# Patient Record
Sex: Female | Born: 1975
Health system: Southern US, Community
[De-identification: ages and names within clinical notes are randomized; demographics above are authoritative.]

## PROBLEM LIST (undated history)

## (undated) ENCOUNTER — Inpatient Hospital Stay (HOSPITAL_COMMUNITY): Payer: 59

## (undated) DIAGNOSIS — D259 Leiomyoma of uterus, unspecified: Secondary | ICD-10-CM

## (undated) DIAGNOSIS — J302 Other seasonal allergic rhinitis: Secondary | ICD-10-CM

## (undated) DIAGNOSIS — J45909 Unspecified asthma, uncomplicated: Secondary | ICD-10-CM

## (undated) DIAGNOSIS — N921 Excessive and frequent menstruation with irregular cycle: Secondary | ICD-10-CM

## (undated) DIAGNOSIS — J4520 Mild intermittent asthma, uncomplicated: Secondary | ICD-10-CM

## (undated) DIAGNOSIS — Z862 Personal history of diseases of the blood and blood-forming organs and certain disorders involving the immune mechanism: Secondary | ICD-10-CM

## (undated) DIAGNOSIS — N939 Abnormal uterine and vaginal bleeding, unspecified: Secondary | ICD-10-CM

## (undated) HISTORY — PX: TUBAL LIGATION: SHX77

## (undated) HISTORY — PX: OTHER SURGICAL HISTORY: SHX169

---

## 1997-12-17 ENCOUNTER — Inpatient Hospital Stay (HOSPITAL_COMMUNITY): Admission: AD | Admit: 1997-12-17 | Discharge: 1997-12-17 | Payer: Self-pay | Admitting: Obstetrics

## 1997-12-24 ENCOUNTER — Inpatient Hospital Stay (HOSPITAL_COMMUNITY): Admission: AD | Admit: 1997-12-24 | Discharge: 1997-12-24 | Payer: Self-pay | Admitting: *Deleted

## 1998-01-04 ENCOUNTER — Encounter (HOSPITAL_COMMUNITY): Admission: RE | Admit: 1998-01-04 | Discharge: 1998-01-07 | Payer: Self-pay | Admitting: Obstetrics & Gynecology

## 1998-01-04 ENCOUNTER — Inpatient Hospital Stay (HOSPITAL_COMMUNITY): Admission: AD | Admit: 1998-01-04 | Discharge: 1998-01-07 | Payer: Self-pay | Admitting: Obstetrics & Gynecology

## 1998-08-23 ENCOUNTER — Emergency Department (HOSPITAL_COMMUNITY): Admission: EM | Admit: 1998-08-23 | Discharge: 1998-08-23 | Payer: Self-pay | Admitting: Emergency Medicine

## 1999-11-10 HISTORY — PX: TUBAL LIGATION: SHX77

## 2000-07-16 ENCOUNTER — Encounter: Payer: Self-pay | Admitting: Obstetrics and Gynecology

## 2000-07-16 ENCOUNTER — Ambulatory Visit (HOSPITAL_COMMUNITY): Admission: RE | Admit: 2000-07-16 | Discharge: 2000-07-16 | Payer: Self-pay | Admitting: Obstetrics and Gynecology

## 2000-08-12 ENCOUNTER — Encounter: Payer: Self-pay | Admitting: Obstetrics and Gynecology

## 2000-08-12 ENCOUNTER — Ambulatory Visit (HOSPITAL_COMMUNITY): Admission: RE | Admit: 2000-08-12 | Discharge: 2000-08-12 | Payer: Self-pay | Admitting: Obstetrics and Gynecology

## 2000-08-17 ENCOUNTER — Inpatient Hospital Stay (HOSPITAL_COMMUNITY): Admission: AD | Admit: 2000-08-17 | Discharge: 2000-08-17 | Payer: Self-pay | Admitting: *Deleted

## 2000-08-19 ENCOUNTER — Encounter: Payer: Self-pay | Admitting: Obstetrics and Gynecology

## 2000-08-19 ENCOUNTER — Encounter (HOSPITAL_COMMUNITY): Admission: AD | Admit: 2000-08-19 | Discharge: 2000-09-21 | Payer: Self-pay | Admitting: Obstetrics and Gynecology

## 2000-08-27 ENCOUNTER — Encounter: Payer: Self-pay | Admitting: Obstetrics and Gynecology

## 2000-09-06 ENCOUNTER — Inpatient Hospital Stay (HOSPITAL_COMMUNITY): Admission: AD | Admit: 2000-09-06 | Discharge: 2000-09-06 | Payer: Self-pay | Admitting: Obstetrics and Gynecology

## 2000-09-06 ENCOUNTER — Encounter: Payer: Self-pay | Admitting: Obstetrics and Gynecology

## 2000-09-09 ENCOUNTER — Inpatient Hospital Stay (HOSPITAL_COMMUNITY): Admission: AD | Admit: 2000-09-09 | Discharge: 2000-09-09 | Payer: Self-pay | Admitting: Obstetrics and Gynecology

## 2000-09-10 ENCOUNTER — Encounter: Payer: Self-pay | Admitting: Obstetrics and Gynecology

## 2000-09-13 ENCOUNTER — Encounter: Payer: Self-pay | Admitting: Obstetrics and Gynecology

## 2000-09-20 ENCOUNTER — Encounter (INDEPENDENT_AMBULATORY_CARE_PROVIDER_SITE_OTHER): Payer: Self-pay | Admitting: Specialist

## 2000-09-20 ENCOUNTER — Inpatient Hospital Stay (HOSPITAL_COMMUNITY): Admission: AD | Admit: 2000-09-20 | Discharge: 2000-09-22 | Payer: Self-pay | Admitting: Obstetrics and Gynecology

## 2000-10-25 ENCOUNTER — Emergency Department (HOSPITAL_COMMUNITY): Admission: EM | Admit: 2000-10-25 | Discharge: 2000-10-25 | Payer: Self-pay | Admitting: *Deleted

## 2000-11-15 ENCOUNTER — Other Ambulatory Visit: Admission: RE | Admit: 2000-11-15 | Discharge: 2000-11-15 | Payer: Self-pay | Admitting: Obstetrics and Gynecology

## 2001-01-04 ENCOUNTER — Emergency Department (HOSPITAL_COMMUNITY): Admission: EM | Admit: 2001-01-04 | Discharge: 2001-01-04 | Payer: Self-pay | Admitting: Emergency Medicine

## 2001-08-01 ENCOUNTER — Emergency Department (HOSPITAL_COMMUNITY): Admission: EM | Admit: 2001-08-01 | Discharge: 2001-08-01 | Payer: Self-pay | Admitting: Emergency Medicine

## 2002-08-15 ENCOUNTER — Emergency Department (HOSPITAL_COMMUNITY): Admission: EM | Admit: 2002-08-15 | Discharge: 2002-08-16 | Payer: Self-pay | Admitting: Emergency Medicine

## 2005-09-21 ENCOUNTER — Emergency Department (HOSPITAL_COMMUNITY): Admission: EM | Admit: 2005-09-21 | Discharge: 2005-09-21 | Payer: Self-pay | Admitting: Emergency Medicine

## 2006-04-14 ENCOUNTER — Emergency Department (HOSPITAL_COMMUNITY): Admission: EM | Admit: 2006-04-14 | Discharge: 2006-04-14 | Payer: Self-pay | Admitting: Emergency Medicine

## 2006-12-12 ENCOUNTER — Emergency Department (HOSPITAL_COMMUNITY): Admission: EM | Admit: 2006-12-12 | Discharge: 2006-12-13 | Payer: Self-pay | Admitting: Emergency Medicine

## 2007-01-28 ENCOUNTER — Emergency Department (HOSPITAL_COMMUNITY): Admission: EM | Admit: 2007-01-28 | Discharge: 2007-01-29 | Payer: Self-pay | Admitting: Emergency Medicine

## 2007-07-24 ENCOUNTER — Emergency Department (HOSPITAL_COMMUNITY): Admission: EM | Admit: 2007-07-24 | Discharge: 2007-07-24 | Payer: Self-pay | Admitting: Emergency Medicine

## 2008-04-19 IMAGING — CR DG CHEST 2V
2 series · 2 of 2 positions shown · non-contrast
Comparison: 12/13/2006

CLINICAL DATA: Dyspnea. History of asthma. 
 CHEST - 2 VIEW:

[w chest pa]
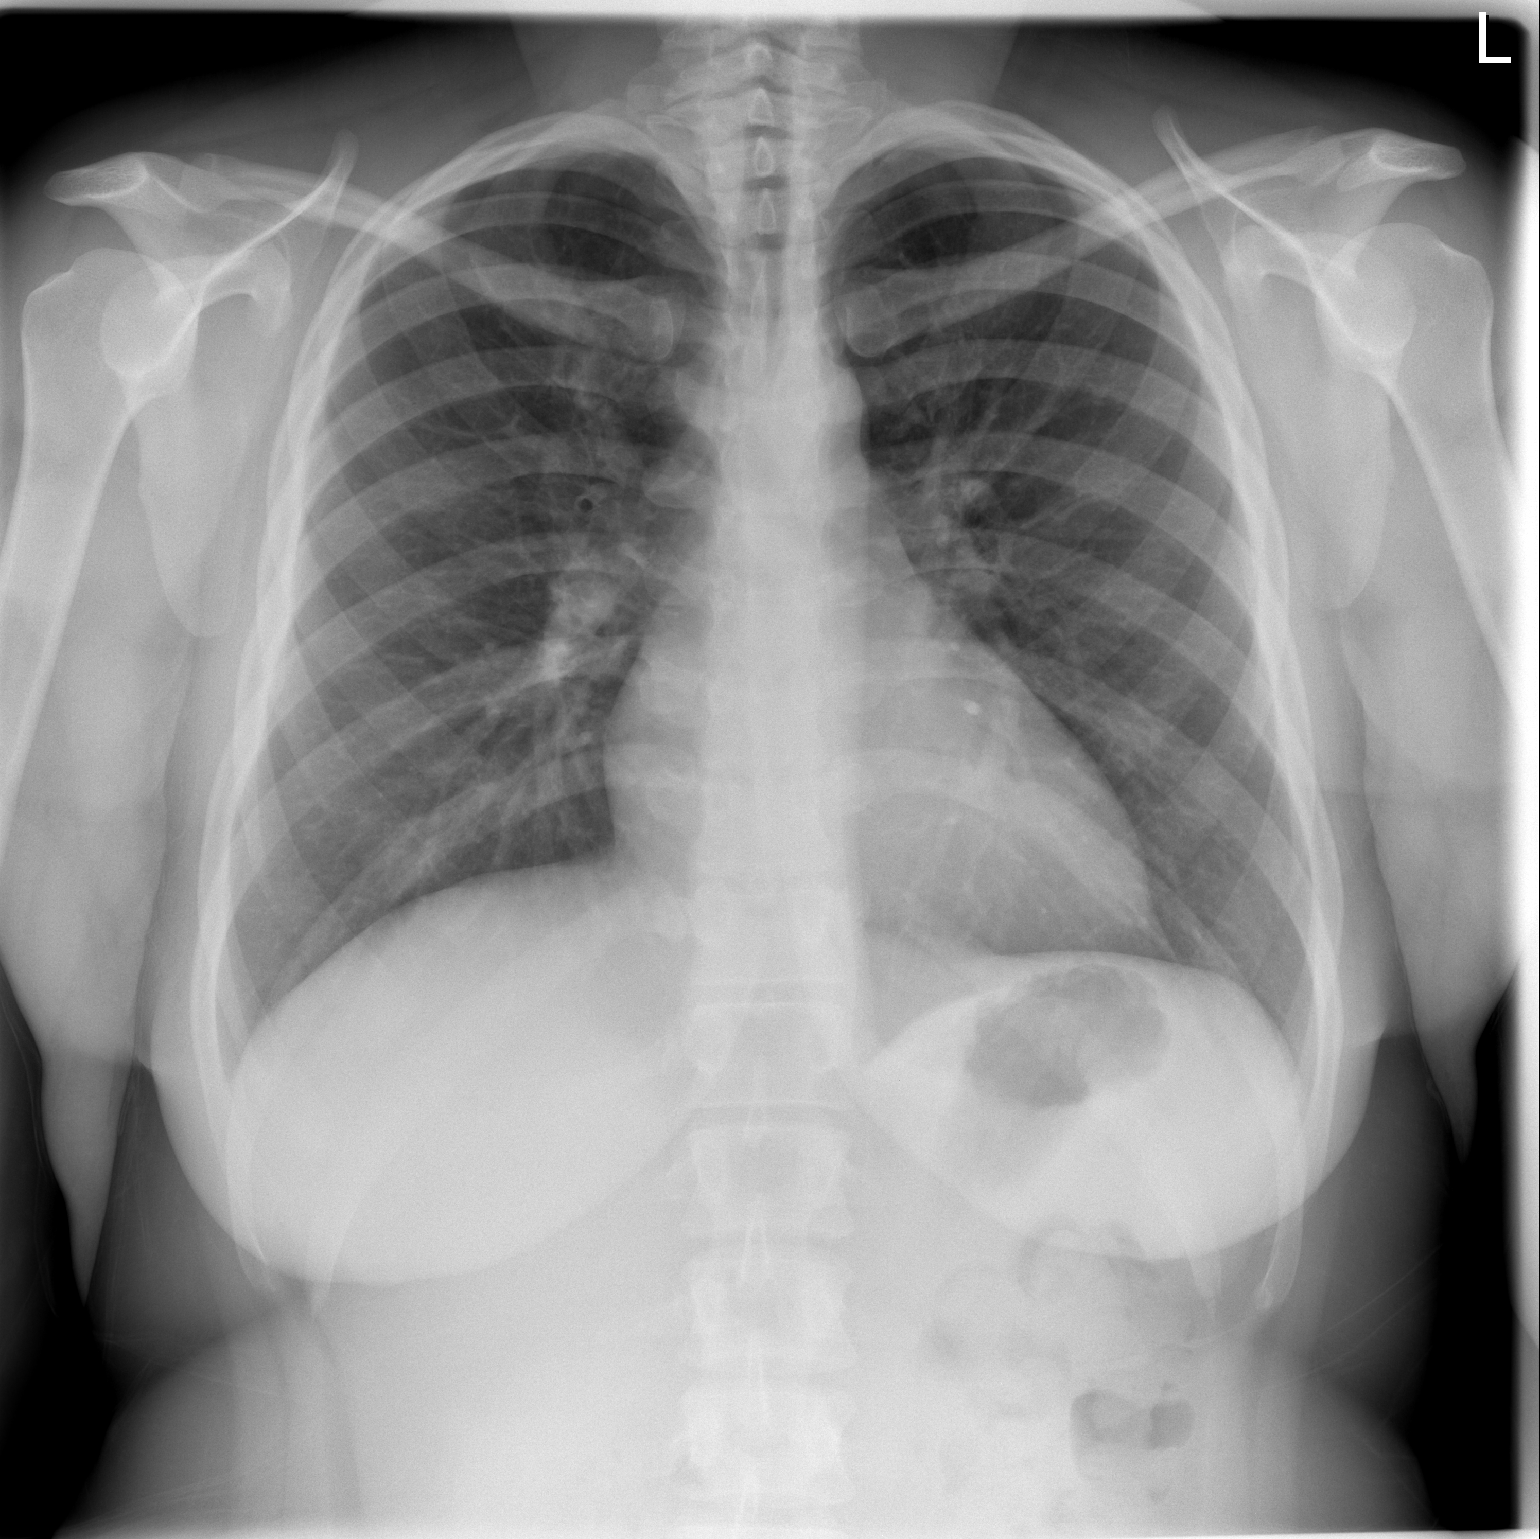

[w chest lat]
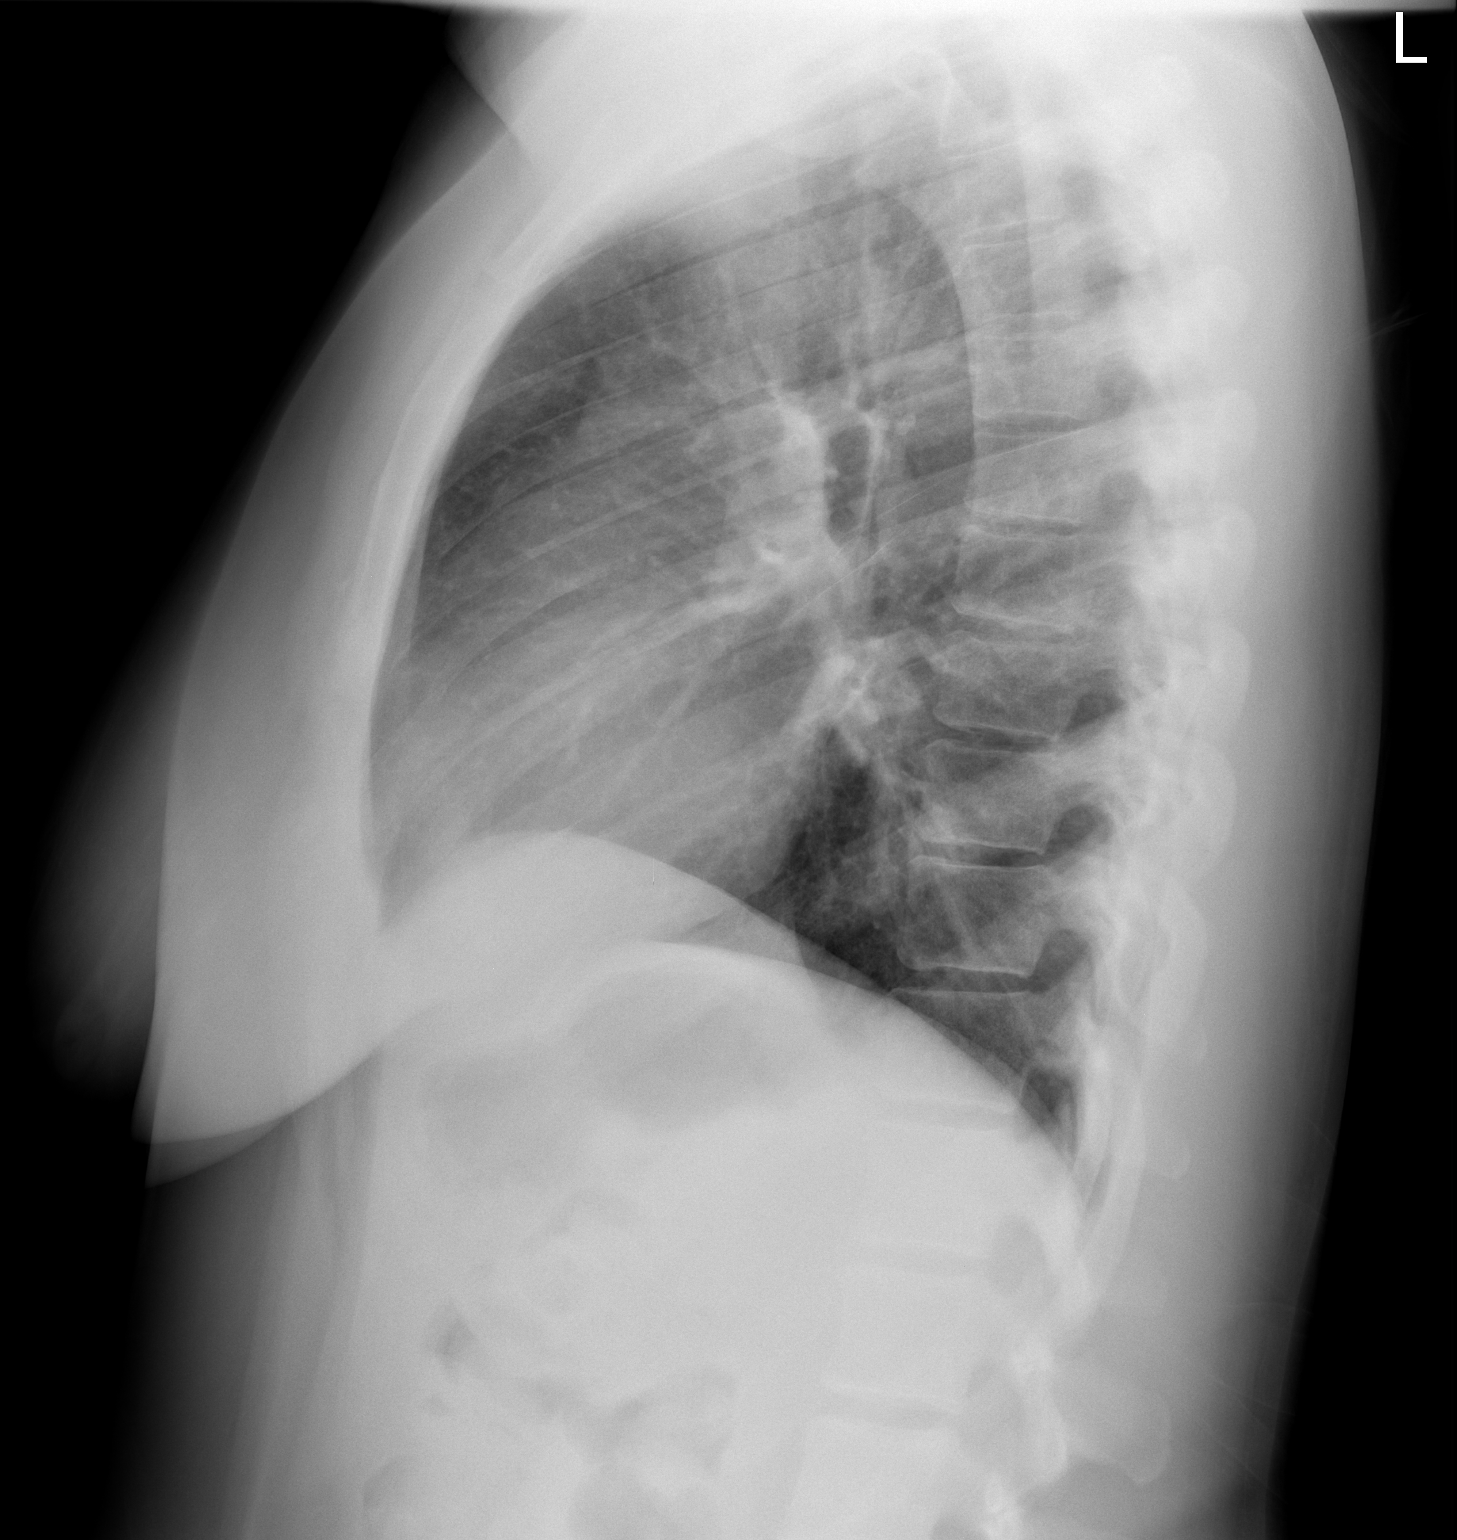

[2 of 2 positions shown; findings below may reference images not displayed]

FINDINGS: Mild peribronchial thickening is noted without focal airspace disease or areas of atelectasis. The lungs are otherwise clear. No pleural effusions or pneumothorax are identified. Cardiomediastinal silhouette is within normal limits as is the bony thorax and upper abdomen.
IMPRESSION: Mild peribronchial thickening without focal airspace disease.

## 2009-06-20 ENCOUNTER — Emergency Department (HOSPITAL_COMMUNITY): Admission: EM | Admit: 2009-06-20 | Discharge: 2009-06-20 | Payer: Self-pay | Admitting: Family Medicine

## 2011-02-14 LAB — POCT I-STAT, CHEM 8
BUN: 10 mg/dL (ref 6–23)
Hemoglobin: 14.3 g/dL (ref 12.0–15.0)
Potassium: 4.1 mEq/L (ref 3.5–5.1)
Sodium: 139 mEq/L (ref 135–145)
TCO2: 20 mmol/L (ref 0–100)

## 2011-03-27 NOTE — Op Note (Signed)
[facility]  Patient:    Brooke [name], Brooke [name]                      MRN: [id number] Proc. Date: 09/21/00 Adm. Date:  [id number] Disc. Date: [id number] Attending:  Oliver Pila                           Operative Report  PREOPERATIVE DIAGNOSES:       Postpartum, desires surgical sterility.  POSTOPERATIVE DIAGNOSES:      Postpartum, desires surgical sterility.  PROCEDURE:                    Bilateral partial salpingectomy.  SURGEON:                      Zenaida Niece, M.D.  ANESTHESIA:                   Epidural.  ESTIMATED BLOOD LOSS:         Less than 50 cc.  FINDINGS:                     Normal anatomy.  COUNTS:                       Correct.  CONDITION:                    Stable.  PROCEDURE:                    After appropriate informed consent was obtained the patient was taken to the operating room and placed in the dorsal supine position.  Her previously placed epidural was dosed appropriately.  Her abdomen was prepped and draped in the usual sterile fashion.  The level for anesthesia was found to be adequate.  Her infraumbilical skin was infiltrated with 1/4% Marcaine and a 3 cm horizontal incision was made.  The fascia was grasped with Kelly clamps and entered sharply and this also entered the peritoneal cavity.  Both fallopian tubes were then identified and traced to their fimbriated ends.  A knuckle of tube on each side was grasped with a Babcock clamp, ligated with 0 plain gut suture, and the knuckle of tube removed sharply.  Both sides of the stumps were hemostatic and both ostia were identified.  The fascia was closed in a running fashion with 0 Vicryl and the skin closed with a running subcuticular suture of 3-0 Vicryl followed by a Band-aid.  Patient tolerated the procedure well and was taken to the recovery room in stable condition. DD:  09/21/00 TD:  09/21/00 Job: [id number]

## 2011-03-27 NOTE — Discharge Summary (Signed)
[facility]  Patient:    Brooke [name], Brooke [name]                      MRN: [id number] Adm. Date:  [id number] Disc. Date: [id number] Attending:  Michaele Offer                           Discharge Summary  ADMISSION DIAGNOSES:          1. Twin pregnancy at 38 weeks.                               2. Pregnancy-induced hypertension.                               3. Thrombocytopenia.                               4. Desired surgical sterility.  DISCHARGE DIAGNOSES:          1. Twin pregnancy at 38 weeks.                               2. Pregnancy-induced hypertension.                               3. Thrombocytopenia.                               4. Desired surgical sterility.  PROCEDURES:                   1. Spontaneous vaginal delivery of viable twins.                                  Twin A vertex, vertex B was a breech                                  extraction.                               2. Postpartum tubal ligation.  COMPLICATIONS:                None.  CONSULTATIONS:                None.  HISTORY OF PRESENT ILLNESS:   This is a 35 year old black female, gravida 4, para 2-0-1-2, with an EGA of 38+ weeks by an 18-week ultrasound, with an EDC of October 01, 2000, with twins.  She was seen in the office today for her scheduled visit by Dr. Senaida Ores.  Today, meaning the day of admission, blood pressure was 140/90.  She had no proteinuria.  No significant symptoms. Vaginal exam was 4, 50, and -3, with a vertex presentation.  She was sent to maternity admissions.  Her blood pressures were stable, but her platelets were only 95,000, so she was admitted for induction.  Prenatal care complicated only by twins.  Last ultrasound for an  estimated fetal weight on August 19, 2000, revealed estimated fetal weights of 2820 and 3100 g, and she has been followed with non-stress test for prior discordant growth.  PRENATAL LABORATORY DATA:     Blood type  A negative, with a negative antibody screen.  Sickle screen is negative.  RPR nonreactive.  Rubella immune. Hepatitis B surface antigen negative.  HIV negative.  Gonorrhea and chlamydia negative.  Triple screen normal.  One-hour Glucola 81.  Group B strep negative.  OBSTETRICAL HISTORY:          In 1997, vaginal delivery at 40 weeks, 7 pounds 10 ounces, no complications.  In 1999, vaginal delivery at 40 weeks, 8 pounds 8 ounces, no complications.  In 2000, she had a spontaneous abortion.  GYNECOLOGICAL HISTORY:        Chlamydia and gonorrhea in 1998.  PAST MEDICAL HISTORY:         Mild asthma.  ALLERGIES:                    PENICILLIN.  PHYSICAL EXAMINATION:         Blood pressures 120s-140s/60s-90s, and she was afebrile.  Fetal heart tracing was reassuring x 2.  On the monitor she had initially irregular contractions.  Abdomen was gravid, nontender, with a fundal height of 51 cm.  Extremities had 1+ edema, nontender.  DTRs were 2/4 and symmetric.  On my first exam on labor and delivery she was 5, 30, -1, with vertex presentation.  HOSPITAL COURSE:              The patient was admitted due to her low platelets and slightly elevated blood pressures with otherwise normal [facility] laboratories and started on Pitocin induction.  She progressed to 5 cm and, at that time, I was able to perform artificial rupture of membranes which revealed clear amniotic fluid.  Her blood pressures remained stable, and she did not require magnesium sulfate.  She continued to progress in active labor and received an epidural.  On the evening of September 20, 2000, she progressed to complete and was taken to the OR.  Baby A delivered vaginally, vertex. Viable female, Apgars of 8 and 9 and weight of 6 pounds 11 ounces.  Baby B was delivered vaginally, a breech extraction.  Viable female, Apgars of 2 and 9, and weight of 7 pounds 1 ounce.  Placenta delivered spontaneously and was intact. Estimated blood loss was 800  cc, and she had no lacerations.  Postpartum she did have some intermittent uterine atony which was treated with one shot of Hemabate which achieved uterine tone and no significant bleeding.  Predelivery hemoglobin was 11.7, postdelivery was 11.8.  On the afternoon of postpartum day #1, she desired surgical sterility and had a bilateral partial salpingectomy performed under her epigastric anesthesia.  This was done without complications, and she had normal anatomy.  She recovered well from this and, on the morning of postpartum day #2, was feeding her babies well and was stable enough for discharge.  Blood pressures were 120s-130s/80s-90s.  Her dressing over her tubal incision was dry.  CONDITION ON DISCHARGE:       Stable.  DISPOSITION:                  Discharged to home.  DIET:                         Regular.  ACTIVITY:  Pelvic rest.  FOLLOW-UP:                    In four to six weeks.  MEDICATIONS:                  Tylenol No. 3 p.r.n. pain.  DISCHARGE INSTRUCTIONS:       She was given our discharge summary pamphlet. DD:  09/22/00 TD:  09/22/00 Job: [id number]

## 2011-08-21 LAB — POCT RAPID STREP A: Streptococcus, Group A Screen (Direct): NEGATIVE

## 2012-04-26 ENCOUNTER — Encounter (HOSPITAL_COMMUNITY): Payer: Self-pay

## 2012-04-26 ENCOUNTER — Emergency Department (HOSPITAL_COMMUNITY)
Admission: EM | Admit: 2012-04-26 | Discharge: 2012-04-26 | Disposition: A | Discharge status: Home / Self Care | Payer: 59 | Source: Home / Self Care | Attending: Emergency Medicine | Admitting: Emergency Medicine

## 2012-04-26 DIAGNOSIS — N390 Urinary tract infection, site not specified: Secondary | ICD-10-CM

## 2012-04-26 LAB — POCT URINALYSIS DIP (DEVICE)
Bilirubin Urine: NEGATIVE
Ketones, ur: NEGATIVE mg/dL
Nitrite: POSITIVE — AB
Protein, ur: NEGATIVE mg/dL

## 2012-04-26 LAB — POCT PREGNANCY, URINE: Preg Test, Ur: NEGATIVE

## 2012-04-26 MED ORDER — NITROFURANTOIN MONOHYD MACRO 100 MG PO CAPS: 100.0000 mg | ORAL_CAPSULE | Freq: Two times a day (BID) | ORAL | Status: AC

## 2012-04-26 NOTE — Discharge Instructions (Signed)
Drink LOTS of liquids. Finish all of the antibiotics, even if you are feeling better.   Urinary Tract Infection Infections of the urinary tract can start in several places. A bladder infection (cystitis), a kidney infection (pyelonephritis), and a prostate infection (prostatitis) are different types of urinary tract infections (UTIs). They usually get better if treated with medicines (antibiotics) that kill germs. Take all the medicine until it is gone. You or your child may feel better in a few days, but TAKE ALL MEDICINE or the infection may not respond and may become more difficult to treat. HOME CARE INSTRUCTIONS   Drink enough water and fluids to keep the urine clear or pale yellow. Cranberry juice is especially recommended, in addition to large amounts of water.   Avoid caffeine, tea, and carbonated beverages. They tend to irritate the bladder.   Alcohol may irritate the prostate.   Only take over-the-counter or prescription medicines for pain, discomfort, or fever as directed by your caregiver.  To prevent further infections:  Empty the bladder often. Avoid holding urine for long periods of time.   After a bowel movement, women should cleanse from front to back. Use each tissue only once.   Empty the bladder before and after sexual intercourse.  FINDING OUT THE RESULTS OF YOUR TEST Not all test results are available during your visit. If your or your child's test results are not back during the visit, make an appointment with your caregiver to find out the results. Do not assume everything is normal if you have not heard from your caregiver or the medical facility. It is important for you to follow up on all test results. SEEK MEDICAL CARE IF:   There is back pain.   Your baby is older than 3 months with a rectal temperature of 100.5 F (38.1 C) or higher for more than 1 day.   Your or your child's problems (symptoms) are no better in 3 days. Return sooner if you or your child is  getting worse.  SEEK IMMEDIATE MEDICAL CARE IF:   There is severe back pain or lower abdominal pain.   You or your child develops chills.   You have a fever.   Your baby is older than 3 months with a rectal temperature of 102 F (38.9 C) or higher.   Your baby is 8 months old or younger with a rectal temperature of 100.4 F (38 C) or higher.   There is nausea or vomiting.   There is continued burning or discomfort with urination.  MAKE SURE YOU:   Understand these instructions.   Will watch your condition.   Will get help right away if you are not doing well or get worse.  Document Released: 08/05/2005 Document Revised: 10/15/2011 Document Reviewed: 03/10/2007 [facility] Patient Information 2012 [place], [place].

## 2012-04-26 NOTE — ED Provider Notes (Signed)
History     CSN: [id number]  Arrival date & time 04/26/12  1102   None     Chief Complaint  Patient presents with  . Urinary Tract Infection    (Consider location/radiation/quality/duration/timing/severity/associated sxs/prior treatment) Patient is a 36 y.o. female presenting with dysuria. The history is provided by the patient.  Dysuria  This is a new problem. Episode onset: 5 days ago. The problem occurs every urination. The problem has been gradually worsening. The quality of the pain is described as burning. The pain is at a severity of 5/10. The pain is moderate. There has been no fever. Associated symptoms include frequency and urgency. Pertinent negatives include no chills, no nausea, no vomiting and no flank pain. Treatments tried: OTC azo for pain. Her past medical history does not include recurrent UTIs.    History reviewed. No pertinent past medical history.  History reviewed. No pertinent past surgical history.  History reviewed. No pertinent family history.  History  Substance Use Topics  . Smoking status: Not on file  . Smokeless tobacco: Not on file  . Alcohol Use: Not on file    OB History    Grav Para Term Preterm Abortions TAB SAB Ect Mult Living                  Review of Systems  Constitutional: Negative for fever and chills.  Gastrointestinal: Positive for abdominal pain. Negative for nausea and vomiting.  Genitourinary: Positive for dysuria, urgency and frequency. Negative for flank pain.    Allergies  Penicillins  Home Medications   Current Outpatient Rx  Name Route Sig Dispense Refill  . NITROFURANTOIN MONOHYD MACRO 100 MG PO CAPS Oral Take 1 capsule (100 mg total) by mouth 2 (two) times daily. 14 capsule 0    BP 137/90  Pulse 90  Temp 98.7 F (37.1 C) (Oral)  Resp 16  SpO2 100%  LMP 04/03/2012  Physical Exam  Constitutional: She appears well-developed and well-nourished. No distress.  Cardiovascular: Normal rate and normal  heart sounds.   Pulmonary/Chest: Effort normal and breath sounds normal.  Abdominal: Normal appearance and bowel sounds are normal. There is tenderness in the suprapubic area. There is no rigidity, no rebound, no guarding and no CVA tenderness.    ED Course  Procedures (including critical care time)  Labs Reviewed  POCT URINALYSIS DIP (DEVICE) - Abnormal; Notable for the following:    Glucose, UA 100 (*)     Nitrite POSITIVE (*)     Leukocytes, UA SMALL (*)  Biochemical Testing Only. Please order routine urinalysis from main lab if confirmatory testing is needed.   All other components within normal limits  POCT PREGNANCY, URINE   No results found.   1. UTI (lower urinary tract infection)       MDM          Cathlyn Parsons, NP 04/26/12 1134

## 2012-04-26 NOTE — ED Notes (Addendum)
C/o pain , urgency of urination since last week; used home test for uti that was positive; denies any STD concerns

## 2012-04-26 NOTE — ED Provider Notes (Signed)
Medical screening examination/treatment/procedure(s) were performed by non-physician practitioner and as supervising physician I was immediately available for consultation/collaboration.  Raynald Blend, MD 04/26/12 1413

## 2012-06-03 ENCOUNTER — Encounter (HOSPITAL_COMMUNITY): Payer: Self-pay | Admitting: *Deleted

## 2012-06-03 ENCOUNTER — Emergency Department (HOSPITAL_COMMUNITY)
Admission: EM | Admit: 2012-06-03 | Discharge: 2012-06-03 | Disposition: A | Discharge status: Home / Self Care | Payer: 59 | Source: Home / Self Care

## 2012-06-03 DIAGNOSIS — R350 Frequency of micturition: Secondary | ICD-10-CM

## 2012-06-03 DIAGNOSIS — R109 Unspecified abdominal pain: Secondary | ICD-10-CM

## 2012-06-03 DIAGNOSIS — R102 Pelvic and perineal pain: Secondary | ICD-10-CM

## 2012-06-03 DIAGNOSIS — N390 Urinary tract infection, site not specified: Secondary | ICD-10-CM

## 2012-06-03 HISTORY — DX: Unspecified asthma, uncomplicated: J45.909

## 2012-06-03 LAB — POCT URINALYSIS DIP (DEVICE)
Glucose, UA: NEGATIVE mg/dL
Ketones, ur: NEGATIVE mg/dL
Protein, ur: 30 mg/dL — AB
Specific Gravity, Urine: 1.025 (ref 1.005–1.030)
pH: 7 (ref 5.0–8.0)

## 2012-06-03 LAB — POCT PREGNANCY, URINE: Preg Test, Ur: NEGATIVE

## 2012-06-03 MED ORDER — CIPROFLOXACIN HCL 500 MG PO TABS: 500.0000 mg | ORAL_TABLET | Freq: Two times a day (BID) | ORAL | Status: AC

## 2012-06-03 MED ORDER — PHENAZOPYRIDINE HCL 200 MG PO TABS: 200.0000 mg | ORAL_TABLET | Freq: Three times a day (TID) | ORAL | Status: AC

## 2012-06-03 NOTE — ED Notes (Signed)
pT  HAS  SYMPTOMS  OF  URINARY  FREQUENCY   WITH  LOW  ABD  PAIN       AND  DISCOMFORT -  SHE  REPORTS  ABOUT  1  MONTH   AGO  SHE  HAD  A  UTI  AND  WAS  TREATED  WITH  ANTI  BIOTICS        SHE  DENYS  ANY  VAGINAL BLEEDING OR  DISCHARGE

## 2012-06-03 NOTE — ED Provider Notes (Signed)
History     CSN: [id number]  Arrival date & time 06/03/12  1436   None     Chief Complaint  Patient presents with  . Urinary Frequency    (Consider location/radiation/quality/duration/timing/severity/associated sxs/prior treatment) Patient is a 36 y.o. female presenting with frequency. The history is provided by the patient.  Urinary Frequency This is a new problem. The current episode started yesterday. The problem occurs constantly. The problem has been gradually worsening. Associated symptoms include abdominal pain. Nothing aggravates the symptoms. Nothing relieves the symptoms. She has tried nothing for the symptoms.  Previously treated for same one month ago with Nitrofurantoin.  Reports relief of symptoms.  Past Medical History  Diagnosis Date  . Asthma     Past Surgical History  Procedure Date  . Btl     History reviewed. No pertinent family history.  History  Substance Use Topics  . Smoking status: Not on file  . Smokeless tobacco: Not on file  . Alcohol Use:     OB History    Grav Para Term Preterm Abortions TAB SAB Ect Mult Living                  Review of Systems  Constitutional: Negative for fever and chills.  Gastrointestinal: Positive for abdominal pain.  Genitourinary: Positive for urgency, frequency, difficulty urinating and pelvic pain. Negative for dysuria, hematuria, flank pain, decreased urine volume, vaginal bleeding, vaginal discharge, menstrual problem and dyspareunia.  Hematological: Negative for adenopathy.    Allergies  Penicillins  Home Medications   Current Outpatient Rx  Name Route Sig Dispense Refill  . ALBUTEROL SULFATE HFA 108 (90 BASE) MCG/ACT IN AERS Inhalation Inhale 2 puffs into the lungs every 6 (six) hours as needed.    Marland Kitchen CIPROFLOXACIN HCL 500 MG PO TABS Oral Take 1 tablet (500 mg total) by mouth every 12 (twelve) hours. 10 tablet 0    BP 140/75  Pulse 78  Temp 98.2 F (36.8 C) (Oral)  Resp 16  SpO2 98%  LMP  04/21/2012  Physical Exam  Nursing note and vitals reviewed. Constitutional: She is oriented to person, place, and time. Vital signs are normal. She appears well-developed and well-nourished. She is active and cooperative.  HENT:  Head: Normocephalic.  Eyes: Conjunctivae are normal. Pupils are equal, round, and reactive to light. No scleral icterus.  Neck: Trachea normal. Neck supple.  Cardiovascular: Normal rate and regular rhythm.   Pulmonary/Chest: Effort normal and breath sounds normal.  Abdominal: Soft. Normal appearance and bowel sounds are normal. She exhibits no distension and no mass. There is no hepatosplenomegaly. There is tenderness in the suprapubic area. There is no rebound and no CVA tenderness. Hernia confirmed negative in the right inguinal area and confirmed negative in the left inguinal area.  Neurological: She is alert and oriented to person, place, and time. No cranial nerve deficit or sensory deficit.  Skin: Skin is warm and dry.  Psychiatric: She has a normal mood and affect. Her speech is normal and behavior is normal. Judgment and thought content normal. Cognition and memory are normal.    ED Course  Procedures (including critical care time)  Labs Reviewed  POCT URINALYSIS DIP (DEVICE) - Abnormal; Notable for the following:    Hgb urine dipstick MODERATE (*)     Protein, ur 30 (*)     Leukocytes, UA LARGE (*)  Biochemical Testing Only. Please order routine urinalysis from main lab if confirmatory testing is needed.   All other components  within normal limits  POCT PREGNANCY, URINE  URINE CULTURE   No results found.   1. Urinary frequency   2. Suprapubic abdominal pain   3. Urinary tract infection       MDM  Await culture, take antibiotics as prescribed, increase fluid intake.        Johnsie Kindred, NP 06/03/12 1641

## 2012-06-03 NOTE — ED Provider Notes (Signed)
Medical screening examination/treatment/procedure(s) were performed by non-physician practitioner and as supervising physician I was immediately available for consultation/collaboration.  Leslee Home, M.D.   Reuben Likes, MD 06/03/12 2204

## 2012-06-06 LAB — URINE CULTURE: Special Requests: NORMAL

## 2012-06-06 NOTE — ED Notes (Signed)
Urine culture: >100,000 colonies E.Coli.  Pt. adequately treated with Cipro. [name] 06/06/2012

## 2012-09-23 ENCOUNTER — Ambulatory Visit (INDEPENDENT_AMBULATORY_CARE_PROVIDER_SITE_OTHER): Payer: 59 | Admitting: Family Medicine

## 2012-09-23 ENCOUNTER — Encounter: Payer: Self-pay | Admitting: Family Medicine

## 2012-09-23 VITALS — BP 134/69 | HR 86 | Temp 98.6°F | Ht 65.0 in | Wt 201.1 lb

## 2012-09-23 DIAGNOSIS — J069 Acute upper respiratory infection, unspecified: Secondary | ICD-10-CM

## 2012-09-23 MED ORDER — ACETAMINOPHEN-CODEINE #3 300-30 MG PO TABS: 1.0000 | ORAL_TABLET | Freq: Three times a day (TID) | ORAL | Status: DC | PRN

## 2012-09-23 MED ORDER — BENZONATATE 100 MG PO CAPS
100.0000 mg | ORAL_CAPSULE | Freq: Three times a day (TID) | ORAL | Status: DC | PRN
Start: 2012-09-23 — End: 2015-08-08

## 2012-09-23 NOTE — Progress Notes (Signed)
  Subjective:     Brooke [name] is a 36 y.o. female who presents for evaluation of symptoms of a URI. Symptoms include congestion, cough described as nonproductive, no  fever and sore throat. Onset of symptoms was 1 week ago, and has been unchanged since that time.  Symptoms are worst at night.  She has had trouble sleeping due to cough. Treatment to date: Albuterol, Mucinex, and other OTC remedies with little relief.  Denies any fevers at home, chills, CP, or SOB.  She did have one episode of post-tussive emesis last Saturday only.  Review of Systems Pertinent items are noted in HPI.   Objective:    BP 134/69  Pulse 86  Temp 98.6 F (37 C) (Oral)  Ht 5\' 5"  (1.651 m)  Wt 201 lb 2 oz (91.23 kg)  BMI 33.47 kg/m2  LMP 09/11/2012 General appearance: alert, cooperative and no distress Head: Normocephalic, without obvious abnormality, atraumatic Nose: clear discharge, mild congestion Throat: lips, mucosa, and tongue normal; teeth and gums normal Neck: mild anterior cervical adenopathy Lungs: clear to auscultation bilaterally Heart: regular rate and rhythm, S1, S2 normal, no murmur, click, rub or gallop Skin: Skin color, texture, turgor normal. No rashes or lesions   Assessment:    viral upper respiratory illness   Plan:    See Problem List

## 2012-09-23 NOTE — Assessment & Plan Note (Signed)
Likely viral URI with post-viral cough.  Afebrile and no wheezes on exam. - Will treat with Tessalon perles PRN during daytime - Tylenol #3 PRN at bedtime - Encouraged plenty of rest and fluids - Red flags reviewed with patient and per AVS

## 2012-09-23 NOTE — Patient Instructions (Addendum)
If you develop fever (temp 101 or higher), worsening cough, decreased appetite, or nausea/vomiting, please call your doctor. Drink plenty of fluids and get plenty of rest this weekend. Hope you feel better soon!  Upper Respiratory Infection, Adult An upper respiratory infection (URI) is also known as the common cold. It is often caused by a type of germ (virus). Colds are easily spread (contagious). You can pass it to others by kissing, coughing, sneezing, or drinking out of the same glass. Usually, you get better in 1 or 2 weeks.  HOME CARE   Only take medicine as told by your doctor.  Use a warm mist humidifier or breathe in steam from a hot shower.  Drink enough water and fluids to keep your pee (urine) clear or pale yellow.  Get plenty of rest.  Return to work when your temperature is back to normal or as told by your doctor. You may use a face mask and wash your hands to stop your cold from spreading. GET HELP RIGHT AWAY IF:   After the first few days, you feel you are getting worse.  You have questions about your medicine.  You have chills, shortness of breath, or brown or red spit (mucus).  You have yellow or brown snot (nasal discharge) or pain in the face, especially when you bend forward.  You have a fever, puffy (swollen) neck, pain when you swallow, or white spots in the back of your throat.  You have a bad headache, ear pain, sinus pain, or chest pain.  You have a high-pitched whistling sound when you breathe in and out (wheezing).  You have a lasting cough or cough up blood.  You have sore muscles or a stiff neck. MAKE SURE YOU:   Understand these instructions.  Will watch your condition.  Will get help right away if you are not doing well or get worse. Document Released: 04/13/2008 Document Revised: 01/18/2012 Document Reviewed: 03/02/2011 [facility] Patient Information 2013 [place], [place].

## 2013-11-20 ENCOUNTER — Encounter: Payer: Self-pay | Admitting: Family Medicine

## 2013-11-20 ENCOUNTER — Ambulatory Visit (INDEPENDENT_AMBULATORY_CARE_PROVIDER_SITE_OTHER): Payer: 59 | Admitting: Family Medicine

## 2013-11-20 VITALS — BP 120/83 | HR 84 | Temp 98.6°F | Resp 14 | Ht 65.0 in | Wt 197.8 lb

## 2013-11-20 DIAGNOSIS — J069 Acute upper respiratory infection, unspecified: Secondary | ICD-10-CM

## 2013-11-20 NOTE — Progress Notes (Signed)
Pt is here to establish care. Complains of being sick recently, but has improved. Had a sore throat, body ache, cough with clear mucus, hot and cold chills, off and on headaches, hoarseness of voice x1 week.

## 2013-11-20 NOTE — Progress Notes (Signed)
   Subjective:    Patient ID: Brooke [name], female    DOB: [birth year], 38 y.o.   MRN: [id number]  HPI Pt presents for sinus congestion since last Wednesday. She reports cough, sinus pressure/pain, chills. Denies fever. Endorses headache. She has taken mucinex which helps. She reports it was worst on Friday and has improved significantly since then. 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   Review of Systems  Constitutional: Positive for chills. Negative for fever.  HENT: Positive for congestion, postnasal drip, rhinorrhea, sinus pressure and voice change.   Respiratory: Positive for cough and shortness of breath.   All other systems reviewed and are negative.       Objective:   Physical Exam  Nursing note and vitals reviewed. Constitutional: She is oriented to person, place, and time. She appears well-developed and well-nourished. No distress.  HENT:  Head: Normocephalic and atraumatic.  Right Ear: External ear normal.  Left Ear: External ear normal.  Nose: Mucosal edema present. No rhinorrhea, nose lacerations, sinus tenderness, nasal deformity, septal deviation or nasal septal hematoma. No epistaxis.  No foreign bodies. Right sinus exhibits no maxillary sinus tenderness and no frontal sinus tenderness. Left sinus exhibits no maxillary sinus tenderness and no frontal sinus tenderness.  Mouth/Throat: Oropharynx is clear and moist. No oropharyngeal exudate.  Eyes: Conjunctivae and EOM are normal. Pupils are equal, round, and reactive to light. Right eye exhibits no discharge. Left eye exhibits no discharge. No scleral icterus.  Neck: Normal range of motion. Neck supple.  Cardiovascular: Normal rate, regular rhythm and normal heart sounds.   No murmur heard. Pulmonary/Chest: Effort normal and breath sounds normal. No respiratory distress. She has no wheezes.  Abdominal: Soft. She exhibits no distension.  Musculoskeletal: Normal range of motion.    Lymphadenopathy:    She has no cervical adenopathy.  Neurological: She is alert and oriented to person, place, and time.  Skin: Skin is warm and dry. She is not diaphoretic.  Psychiatric: She has a normal mood and affect. Her behavior is normal.          Assessment & Plan:

## 2013-11-20 NOTE — Assessment & Plan Note (Signed)
Likely viral URI, resolving, lungs clear, no sinus tenderness/pressure, no fever - continue symptomatic management with mucinex - recommend hot tea and nasal saline

## 2013-11-20 NOTE — Patient Instructions (Signed)

## 2013-11-29 ENCOUNTER — Ambulatory Visit: Payer: 59 | Admitting: Family Medicine

## 2013-12-12 ENCOUNTER — Ambulatory Visit: Payer: 59 | Admitting: Family Medicine

## 2014-05-22 ENCOUNTER — Encounter: Payer: Self-pay | Admitting: Family Medicine

## 2014-05-22 ENCOUNTER — Ambulatory Visit (INDEPENDENT_AMBULATORY_CARE_PROVIDER_SITE_OTHER): Payer: 59 | Admitting: Family Medicine

## 2014-05-22 ENCOUNTER — Other Ambulatory Visit (HOSPITAL_COMMUNITY)
Admission: RE | Admit: 2014-05-22 | Discharge: 2014-05-22 | Disposition: A | Discharge status: Home / Self Care | Payer: 59 | Source: Ambulatory Visit | Attending: Family Medicine | Admitting: Family Medicine

## 2014-05-22 VITALS — BP 128/84 | HR 96 | Temp 98.1°F | Ht 66.0 in | Wt 198.9 lb

## 2014-05-22 DIAGNOSIS — Z01419 Encounter for gynecological examination (general) (routine) without abnormal findings: Secondary | ICD-10-CM

## 2014-05-22 DIAGNOSIS — Z124 Encounter for screening for malignant neoplasm of cervix: Secondary | ICD-10-CM

## 2014-05-22 DIAGNOSIS — F5105 Insomnia due to other mental disorder: Secondary | ICD-10-CM | POA: Insufficient documentation

## 2014-05-22 DIAGNOSIS — F489 Nonpsychotic mental disorder, unspecified: Secondary | ICD-10-CM

## 2014-05-22 DIAGNOSIS — Z1151 Encounter for screening for human papillomavirus (HPV): Secondary | ICD-10-CM | POA: Insufficient documentation

## 2014-05-22 DIAGNOSIS — F409 Phobic anxiety disorder, unspecified: Secondary | ICD-10-CM

## 2014-05-22 MED ORDER — ESZOPICLONE 1 MG PO TABS: 1.0000 mg | ORAL_TABLET | Freq: Every evening | ORAL | Status: DC | PRN

## 2014-05-22 NOTE — Assessment & Plan Note (Addendum)
Likely sleep hygeine plus anxiety, mind races, sleeps with TV on, no caffeine or alcohol - counseled on sleep hygiene - patient not interested in SSRI  - has tried sister's lunesta with good results, trial of this on her request, cautioned about chronic use and dependence

## 2014-05-22 NOTE — Assessment & Plan Note (Signed)
Pap done today  

## 2014-05-22 NOTE — Patient Instructions (Signed)
We will call with the results of your pap smear later this week.  For your insomnia, I have prescribed Lunesta. Do not take this more than twice a week as you can become dependent on it.  Please come back in 6 months or sooner if you are having difficulty.

## 2014-05-22 NOTE — Progress Notes (Signed)
   Subjective:    Patient ID: Brooke [name], female    DOB: 12-11-1975, 38 y.o.   MRN: [id number]  Gynecologic Exam   Pt complains of difficulty sleeping for many months, worse recently and interfering with her job. She sometimes has trouble falling asleep but is more bothered by early awakening and trouble getting back to sleep. She has tried her sisters Alfonso Pattenlunesta which worked well. She does endorse anxiety but denies significant depression, not interested in SSRI   Review of Systems  Constitutional: Positive for fatigue. Negative for activity change, appetite change and unexpected weight change.  Psychiatric/Behavioral: Positive for sleep disturbance and decreased concentration. Negative for suicidal ideas, self-injury and dysphoric mood. The patient is nervous/anxious.   All other systems reviewed and are negative.      Objective:   Physical Exam  Nursing note and vitals reviewed. Constitutional: She is oriented to person, place, and time. She appears well-developed and well-nourished. No distress.  HENT:  Head: Normocephalic and atraumatic.  Eyes: Conjunctivae are normal. Right eye exhibits no discharge. Left eye exhibits no discharge. No scleral icterus.  Cardiovascular: Normal rate.   Pulmonary/Chest: Effort normal.  Abdominal: Soft. She exhibits no distension and no mass. There is no tenderness. There is no rebound and no guarding.  Genitourinary: Vagina normal and uterus normal. There is no rash, tenderness, lesion or injury on the right labia. There is no rash, tenderness, lesion or injury on the left labia. Uterus is not deviated, not enlarged, not fixed and not tender. Cervix exhibits no motion tenderness, no discharge and no friability. Right adnexum displays no mass, no tenderness and no fullness. Left adnexum displays no mass, no tenderness and no fullness. No erythema, tenderness or bleeding around the vagina. No foreign body around the vagina. No signs of injury around the  vagina. No vaginal discharge found.  Neurological: She is alert and oriented to person, place, and time.  Skin: Skin is warm and dry. She is not diaphoretic.  Psychiatric: She has a normal mood and affect. Her behavior is normal. Judgment and thought content normal.          Assessment & Plan:

## 2014-05-23 LAB — CYTOLOGY - PAP

## 2014-05-24 MED ORDER — METRONIDAZOLE 500 MG PO TABS: 500.0000 mg | ORAL_TABLET | Freq: Two times a day (BID) | ORAL | Status: DC

## 2014-05-24 NOTE — Addendum Note (Signed)
Addended by: Abram SanderADAMO, ELENA M on: 05/24/2014 04:40 PM   Modules accepted: Orders

## 2014-05-29 ENCOUNTER — Telehealth: Payer: Self-pay | Admitting: *Deleted

## 2014-05-29 NOTE — Telephone Encounter (Signed)
LVM for patient to call back to inform of lab results if she has not been informed already. Did not see a prior phone note

## 2014-05-29 NOTE — Telephone Encounter (Signed)
Message copied by Farrell OursEVANS, Milus Fritze K on Tue May 29, 2014 11:48 AM ------      Message from: Abram SanderADAMO, ELENA M      Created: Thu May 24, 2014  4:43 PM       Please inform Brooke [name] that her pap smear was normal. However, it did show trichomonas. I sent in a script for flagyl which will cure the infection but she should also ask any sexual partners to go to their doctor or the health department to get tested/treated. Thanks! ------

## 2014-11-27 ENCOUNTER — Encounter: Payer: Self-pay | Admitting: Family Medicine

## 2014-11-27 ENCOUNTER — Other Ambulatory Visit: Payer: Self-pay | Admitting: Family Medicine

## 2014-11-27 MED ORDER — ALBUTEROL SULFATE HFA 108 (90 BASE) MCG/ACT IN AERS
2.0000 | INHALATION_SPRAY | Freq: Four times a day (QID) | RESPIRATORY_TRACT | Status: DC | PRN
Start: 2014-11-27 — End: 2016-06-12

## 2015-01-07 ENCOUNTER — Encounter: Payer: Self-pay | Admitting: Family Medicine

## 2015-01-07 ENCOUNTER — Ambulatory Visit (INDEPENDENT_AMBULATORY_CARE_PROVIDER_SITE_OTHER): Payer: 59 | Admitting: Family Medicine

## 2015-01-07 VITALS — BP 132/78 | HR 72 | Temp 98.6°F | Wt 185.0 lb

## 2015-01-07 DIAGNOSIS — J069 Acute upper respiratory infection, unspecified: Secondary | ICD-10-CM

## 2015-01-07 NOTE — Assessment & Plan Note (Addendum)
Patient with signs and symptoms of viral URI with only 2 days worth of symptoms. Discussed that this is likely a viral infection at this time and not bacterial. Doubt flu without documented fever. Advised on supportive care including continued flonase, using nasal saline, and OTC zyrtec. Tylenol and ibuprofen for discomfort. Pseudoephedrine if congestion worsens. Stay well hydrated. Given return precautions. F/u early next week if not improving.

## 2015-01-07 NOTE — Patient Instructions (Signed)
You likely have a viral cold. Please use nasal saline, flonase, over the counter zyrtec for this issue. You could use pseudoephedrine as well if congestion worsens. This may cause rapid heart rate and trouble sleeping.  If you develop fever, shortness of breath, chest pain, decreased PO intake, or your symptoms don't improve by next week please seek medical attention.   Upper Respiratory Infection, Adult An upper respiratory infection (URI) is also sometimes known as the common cold. The upper respiratory tract includes the nose, sinuses, throat, trachea, and bronchi. Bronchi are the airways leading to the lungs. Most people improve within 1 week, but symptoms can last up to 2 weeks. A residual cough may last even longer.  CAUSES Many different viruses can infect the tissues lining the upper respiratory tract. The tissues become irritated and inflamed and often become very moist. Mucus production is also common. A cold is contagious. You can easily spread the virus to others by oral contact. This includes kissing, sharing a glass, coughing, or sneezing. Touching your mouth or nose and then touching a surface, which is then touched by another person, can also spread the virus. SYMPTOMS  Symptoms typically develop 1 to 3 days after you come in contact with a cold virus. Symptoms vary from person to person. They may include:  Runny nose.  Sneezing.  Nasal congestion.  Sinus irritation.  Sore throat.  Loss of voice (laryngitis).  Cough.  Fatigue.  Muscle aches.  Loss of appetite.  Headache.  Low-grade fever. DIAGNOSIS  You might diagnose your own cold based on familiar symptoms, since most people get a cold 2 to 3 times a year. Your caregiver can confirm this based on your exam. Most importantly, your caregiver can check that your symptoms are not due to another disease such as strep throat, sinusitis, pneumonia, asthma, or epiglottitis. Blood tests, throat tests, and X-rays are not  necessary to diagnose a common cold, but they may sometimes be helpful in excluding other more serious diseases. Your caregiver will decide if any further tests are required. RISKS AND COMPLICATIONS  You may be at risk for a more severe case of the common cold if you smoke cigarettes, have chronic heart disease (such as heart failure) or lung disease (such as asthma), or if you have a weakened immune system. The very young and very old are also at risk for more serious infections. Bacterial sinusitis, middle ear infections, and bacterial pneumonia can complicate the common cold. The common cold can worsen asthma and chronic obstructive pulmonary disease (COPD). Sometimes, these complications can require emergency medical care and may be life-threatening. PREVENTION  The best way to protect against getting a cold is to practice good hygiene. Avoid oral or hand contact with people with cold symptoms. Wash your hands often if contact occurs. There is no clear evidence that vitamin C, vitamin E, echinacea, or exercise reduces the chance of developing a cold. However, it is always recommended to get plenty of rest and practice good nutrition. TREATMENT  Treatment is directed at relieving symptoms. There is no cure. Antibiotics are not effective, because the infection is caused by a virus, not by bacteria. Treatment may include:  Increased fluid intake. Sports drinks offer valuable electrolytes, sugars, and fluids.  Breathing heated mist or steam (vaporizer or shower).  Eating chicken soup or other clear broths, and maintaining good nutrition.  Getting plenty of rest.  Using gargles or lozenges for comfort.  Controlling fevers with ibuprofen or acetaminophen as directed by  your caregiver.  Increasing usage of your inhaler if you have asthma. Zinc gel and zinc lozenges, taken in the first 24 hours of the common cold, can shorten the duration and lessen the severity of symptoms. Pain medicines may help  with fever, muscle aches, and throat pain. A variety of non-prescription medicines are available to treat congestion and runny nose. Your caregiver can make recommendations and may suggest nasal or lung inhalers for other symptoms.  HOME CARE INSTRUCTIONS   Only take over-the-counter or prescription medicines for pain, discomfort, or fever as directed by your caregiver.  Use a warm mist humidifier or inhale steam from a shower to increase air moisture. This may keep secretions moist and make it easier to breathe.  Drink enough water and fluids to keep your urine clear or pale yellow.  Rest as needed.  Return to work when your temperature has returned to normal or as your caregiver advises. You may need to stay home longer to avoid infecting others. You can also use a face mask and careful hand washing to prevent spread of the virus. SEEK MEDICAL CARE IF:   After the first few days, you feel you are getting worse rather than better.  You need your caregiver's advice about medicines to control symptoms.  You develop chills, worsening shortness of breath, or brown or red sputum. These may be signs of pneumonia.  You develop yellow or brown nasal discharge or pain in the face, especially when you bend forward. These may be signs of sinusitis.  You develop a fever, swollen neck glands, pain with swallowing, or white areas in the back of your throat. These may be signs of strep throat. SEEK IMMEDIATE MEDICAL CARE IF:   You have a fever.  You develop severe or persistent headache, ear pain, sinus pain, or chest pain.  You develop wheezing, a prolonged cough, cough up blood, or have a change in your usual mucus (if you have chronic lung disease).  You develop sore muscles or a stiff neck. Document Released: 04/21/2001 Document Revised: 01/18/2012 Document Reviewed: 01/31/2014 [facility] Patient Information 2015 [employer] [place]. This information is not intended to replace advice given to you by  your health care provider. Make sure you discuss any questions you have with your health care provider.

## 2015-01-07 NOTE — Progress Notes (Signed)
Patient ID: Brooke [name], female   DOB: 1975-12-10, 39 y.o.   MRN: [id number]  Brooke AlarEric Tyson Masin, MD Phone: [phone]  Brooke [name] is a 39 y.o. female who presents today for same day appointment.   Congestion: notes onset of nasal and sinus congestion on Saturday evening. Notes post nasal drip and yellow mucus from her nose. No cough, dyspnea, or ear discomfort. No fevers, though does notes some chills. Also with body aches. Some rapid heart rate with this. 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 Has not had antibiotics recently. States this feels like a sinus infection. Has been using flonase with minimal benefit.   Patient is a nonsmoker.   ROS: Per HPI   Physical Exam Filed Vitals:   01/07/15 1601  BP: 132/78  Pulse: 72  Temp: 98.6 F (37 C)    Gen: Well NAD HEENT: PERRL,  MMM, normal TM bilateral, mild erythema posterior OP, no exudate, no cervical LAD Lungs: CTABL Nl WOB Heart: RRR  Exts: Non edematous BL  LE, warm and well perfused.    Assessment/Plan: Please see individual problem list.  Brooke AlarEric Blaine Guiffre, MD [facility] PGY-3

## 2015-02-13 ENCOUNTER — Other Ambulatory Visit: Payer: Self-pay | Admitting: Family Medicine

## 2015-02-13 DIAGNOSIS — B9689 Other specified bacterial agents as the cause of diseases classified elsewhere: Secondary | ICD-10-CM

## 2015-02-13 DIAGNOSIS — N760 Acute vaginitis: Secondary | ICD-10-CM

## 2015-02-13 MED ORDER — METRONIDAZOLE 500 MG PO TABS: 500.0000 mg | ORAL_TABLET | Freq: Two times a day (BID) | ORAL | Status: DC

## 2015-08-08 ENCOUNTER — Encounter: Payer: Self-pay | Admitting: Family Medicine

## 2015-08-08 ENCOUNTER — Ambulatory Visit (INDEPENDENT_AMBULATORY_CARE_PROVIDER_SITE_OTHER): Payer: 59 | Admitting: Family Medicine

## 2015-08-08 VITALS — BP 140/82 | HR 79 | Temp 98.2°F | Ht 66.0 in | Wt 184.0 lb

## 2015-08-08 DIAGNOSIS — N9489 Other specified conditions associated with female genital organs and menstrual cycle: Secondary | ICD-10-CM

## 2015-08-08 DIAGNOSIS — M545 Low back pain, unspecified: Secondary | ICD-10-CM

## 2015-08-08 DIAGNOSIS — N760 Acute vaginitis: Secondary | ICD-10-CM

## 2015-08-08 DIAGNOSIS — M25531 Pain in right wrist: Secondary | ICD-10-CM

## 2015-08-08 DIAGNOSIS — B9689 Other specified bacterial agents as the cause of diseases classified elsewhere: Secondary | ICD-10-CM

## 2015-08-08 DIAGNOSIS — G8929 Other chronic pain: Secondary | ICD-10-CM

## 2015-08-08 DIAGNOSIS — A499 Bacterial infection, unspecified: Secondary | ICD-10-CM

## 2015-08-08 DIAGNOSIS — N898 Other specified noninflammatory disorders of vagina: Secondary | ICD-10-CM

## 2015-08-08 LAB — POCT WET PREP (WET MOUNT): Clue Cells Wet Prep Whiff POC: NEGATIVE

## 2015-08-08 MED ORDER — METRONIDAZOLE 500 MG PO TABS: 500.0000 mg | ORAL_TABLET | Freq: Two times a day (BID) | ORAL | Status: DC

## 2015-08-08 MED ORDER — MELOXICAM 15 MG PO TABS: 15.0000 mg | ORAL_TABLET | Freq: Every day | ORAL | Status: DC

## 2015-08-08 MED ORDER — TRAMADOL HCL 50 MG PO TABS
50.0000 mg | ORAL_TABLET | Freq: Three times a day (TID) | ORAL | Status: DC | PRN
Start: 2015-08-08 — End: 2018-03-10

## 2015-08-08 NOTE — Patient Instructions (Addendum)
See handout on exercises Take meloxicam daily. No ibuprofen, advil, aleve, etc while on this medicine Tramadol for when pain is severe Go get xrays of back Okay to call and get chiropracter appointment Try ice/heat as needed  Take metronidazole  twice a day  Follow up with Dr. Richarda Blade in 1 month  Be well, Dr. Pollie Meyer   Bacterial Vaginosis Bacterial vaginosis is a vaginal infection that occurs when the normal balance of bacteria in the vagina is disrupted. It results from an overgrowth of certain bacteria. This is the most common vaginal infection in women of childbearing age. Treatment is important to prevent complications, especially in pregnant women, as it can cause a premature delivery. CAUSES  Bacterial vaginosis is caused by an increase in harmful bacteria that are normally present in smaller amounts in the vagina. Several different kinds of bacteria can cause bacterial vaginosis. However, the reason that the condition develops is not fully understood. RISK FACTORS Certain activities or behaviors can put you at an increased risk of developing bacterial vaginosis, including:  Having a new sex partner or multiple sex partners.  Douching.  Using an intrauterine device (IUD) for contraception. Women do not get bacterial vaginosis from toilet seats, bedding, swimming pools, or contact with objects around them. SIGNS AND SYMPTOMS  Some women with bacterial vaginosis have no signs or symptoms. Common symptoms include:  Grey vaginal discharge.  A fishlike odor with discharge, especially after sexual intercourse.  Itching or burning of the vagina and vulva.  Burning or pain with urination. DIAGNOSIS  Your health care provider will take a medical history and examine the vagina for signs of bacterial vaginosis. A sample of vaginal fluid may be taken. Your health care provider will look at this sample under a microscope to check for bacteria and abnormal cells. A vaginal pH test  may also be done.  TREATMENT  Bacterial vaginosis may be treated with antibiotic medicines. These may be given in the form of a pill or a vaginal cream. A second round of antibiotics may be prescribed if the condition comes back after treatment.  HOME CARE INSTRUCTIONS   Only take over-the-counter or prescription medicines as directed by your health care provider.  If antibiotic medicine was prescribed, take it as directed. Make sure you finish it even if you start to feel better.  Do not have sex until treatment is completed.  Tell all sexual partners that you have a vaginal infection. They should see their health care provider and be treated if they have problems, such as a mild rash or itching.  Practice safe sex by using condoms and only having one sex partner. SEEK MEDICAL CARE IF:   Your symptoms are not improving after 3 days of treatment.  You have increased discharge or pain.  You have a fever. MAKE SURE YOU:   Understand these instructions.  Will watch your condition.  Will get help right away if you are not doing well or get worse. FOR MORE INFORMATION  Centers for Disease Control and Prevention, Division of STD Prevention: [employer] American Sexual Health Association (ASHA): www.ashastd.org  Document Released: 10/26/2005 Document Revised: 08/16/2013 Document Reviewed: 06/07/2013 [facility] Patient Information 2015 [place], [place]. This information is not intended to replace advice given to you by your health care provider. Make sure you discuss any questions you have with your health care provider.

## 2015-08-09 ENCOUNTER — Telehealth: Payer: Self-pay | Admitting: Family Medicine

## 2015-08-09 DIAGNOSIS — N898 Other specified noninflammatory disorders of vagina: Secondary | ICD-10-CM | POA: Insufficient documentation

## 2015-08-09 DIAGNOSIS — M25531 Pain in right wrist: Secondary | ICD-10-CM | POA: Insufficient documentation

## 2015-08-09 DIAGNOSIS — M545 Low back pain: Secondary | ICD-10-CM

## 2015-08-09 DIAGNOSIS — G8929 Other chronic pain: Secondary | ICD-10-CM | POA: Insufficient documentation

## 2015-08-09 NOTE — Assessment & Plan Note (Signed)
Appears musculoskeletal in etiology. Given chronicity, will obtain xrays as she's never had them. No red flags. -handout on back exercises -mobic  daily for 1 week, then daily as needed -tramadol for when pain is severe, counseled on risk of sedation -ice/heat -patient may call chiropracter for appointment -f/u with PCP in 1 mo.

## 2015-08-09 NOTE — Telephone Encounter (Signed)
Attempted to reach patient to discuss test results. She has +trichomonas. Left VM asking her to return the call. When she calls back please page me at [phone] and I'll be happy to speak with her. I'm also available in preceptor room all day today. Latrelle Dodrill, MD

## 2015-08-09 NOTE — Assessment & Plan Note (Signed)
Records reviewed; patient had +trichomonas last year in July, and does not think she was ever treated or informed of that result. She does endorse history of trichomonas many years ago. With this history, will treat empirically for BV/trichomonas with flagyl  twice daily. Self-wet prep collected today. Will call patient with results so she knows definitively if she still has trichomonas so her husband can also be treated.

## 2015-08-09 NOTE — Progress Notes (Signed)
  HPI:  Back pain - has had low back pain for over 1 year, gradually getting worse. She thinks this is from sitting all day at her job. Works as Scientist, physiological at [employer]. Has tried ibuprofen for it, mild relief. Also tried biofreeze roll. Pain in low midline back. No preceding injuries. Denies having fever, saddle anesthesia, lower extremity weakness, or problems with stooling or urination. Does exercise, tries to go to [employer] 3x/week.  Wrist pain: has pain in R wrist, chronic in nature. Only gets it on the days she works. No numbness/tingling into her fingers. Thinks it's from using mouse at work.  Vaginal odor: has had fishy vaginal odor for a while now. No significant discharge. No pelvic pain. Sexually active with one female partner in the last year (husband). History of tubal ligation. Currently menstruating and would like to avoid pelvic exam.  ROS: See HPI.  PMFSH: no significant PMHx  PHYSICAL EXAM: BP 140/82 mmHg  Pulse 79  Temp(Src) 98.2 F (36.8 C) (Oral)  Ht  (1.676 m)  Wt 184 lb (83.462 kg)  BMI 29.71 kg/m2  LMP 08/05/2015 Gen: NAD, pleasant, cooperative HEENT: NCAT Back: mild low back TTP, no spasm Neuro: normal gait and speech Ext: R wrist without swelling or erythema. No thenar atrophy. Good grip strength. Neurovascularly intact. Negative tinel and phalens. GU - deferred per patient preference  ASSESSMENT/PLAN:  Chronic low back pain Appears musculoskeletal in etiology. Given chronicity, will obtain xrays as she's never had them. No red flags. -handout on back exercises -mobic  daily for 1 week, then daily as needed -tramadol for when pain is severe, counseled on risk of sedation -ice/heat -patient may call chiropracter for appointment -f/u with PCP in 1 mo.  Right wrist pain Due to use of mouse at work. Encouraged her to look for ergonomic mouse options. No signs of carpal tunnel syndrome. Medications for her back should help wrist as  well. Follow up as needed.  Vaginal odor Records reviewed; patient had +trichomonas last year in July, and does not think she was ever treated or informed of that result. She does endorse history of trichomonas many years ago. With this history, will treat empirically for BV/trichomonas with flagyl  twice daily. Self-wet prep collected today. Will call patient with results so she knows definitively if she still has trichomonas so her husband can also be treated.   FOLLOW UP: F/u in 1 mo with PCP for back & wrist pain  [place] J. Pollie Meyer, MD [facility]

## 2015-08-09 NOTE — Assessment & Plan Note (Signed)
Due to use of mouse at work. Encouraged her to look for ergonomic mouse options. No signs of carpal tunnel syndrome. Medications for her back should help wrist as well. Follow up as needed.

## 2015-08-15 NOTE — Telephone Encounter (Signed)
Was able to reach patient and relay message. Advised husband also needs treatment and to abstain from intercourse until one week after both partners have been treated. Patient appreciative. Latrelle Dodrill, MD

## 2015-11-07 ENCOUNTER — Telehealth: Payer: 59 | Admitting: Physician Assistant

## 2015-11-07 DIAGNOSIS — K6289 Other specified diseases of anus and rectum: Secondary | ICD-10-CM

## 2015-11-07 NOTE — Progress Notes (Signed)
Based on what you shared with me it looks like you have a condition that should be evaluated in a face to face office visit. Rectal pain is not a complaint that we are authorized to address via e-visits. Stay hydrated and keep a stool softener in your system to help ease bowel movements. If your provider's office cannot see you, see if your primary provider will call in a medication for you until you can be seen. You can also seek out care at one of the centers below.   If you are having a true medical emergency please call 911.  If you need an urgent face to face visit, [place] has four urgent care centers for your convenience.  Tressie Ellis. [facility]  [phone] Get Driving Directions Find a Provider at this Location  [address] [place], [place] [address] . 8 am to 8 pm Monday-Friday . 9 am to 7 pm Saturday-Sunday  . [facility]  [phone] Get Driving Directions Find a Provider at this Location  [address] [place], [place] [address] . 8 am to 8 pm Monday-Friday . 9 am to 6 pm Saturday . 11 am to 6 pm Sunday   . [facility] [place]  [phone] Get Driving Directions  [address] [place], [place] [address] . 8 am to 8 pm Monday-Friday . 9 am to 4 pm Saturday-Sunday   . [facility] (a walk in primary care provider)  [phone]  Get Driving Directions Find a Provider at this Location  [address] [place], [place] [address] . 8 am to 8:30 pm Monday-Thursday . 8 am to 6 pm Friday . 8 am to 4 pm Saturday-Sunday   Your e-visit answers were reviewed by a board certified advanced clinical practitioner to complete your personal care plan.  Thank you for using e-Visits.

## 2015-12-18 ENCOUNTER — Ambulatory Visit (INDEPENDENT_AMBULATORY_CARE_PROVIDER_SITE_OTHER): Payer: 59 | Admitting: Family Medicine

## 2015-12-18 ENCOUNTER — Encounter: Payer: Self-pay | Admitting: Family Medicine

## 2015-12-18 VITALS — BP 131/73 | HR 86 | Temp 98.7°F | Ht 66.0 in | Wt 175.6 lb

## 2015-12-18 DIAGNOSIS — F409 Phobic anxiety disorder, unspecified: Secondary | ICD-10-CM

## 2015-12-18 DIAGNOSIS — F5105 Insomnia due to other mental disorder: Secondary | ICD-10-CM | POA: Diagnosis not present

## 2015-12-18 DIAGNOSIS — K649 Unspecified hemorrhoids: Secondary | ICD-10-CM

## 2015-12-18 MED ORDER — ESZOPICLONE 1 MG PO TABS: 1.0000 mg | ORAL_TABLET | Freq: Every evening | ORAL | Status: DC | PRN

## 2015-12-18 MED ORDER — POLYETHYLENE GLYCOL 3350 17 GM/SCOOP PO POWD: 17.0000 g | Freq: Two times a day (BID) | ORAL | Status: DC | PRN

## 2015-12-18 MED ORDER — LIDOCAINE-HYDROCORTISONE ACE 2-2 % RE KIT: 1.0000 "application " | PACK | Freq: Two times a day (BID) | RECTAL | Status: DC | PRN

## 2015-12-18 MED FILL — LIDOCAINE-HC 2-2% CREAM KIT: 2-2 | 15 days supply | Qty: 1 | Fill #0

## 2015-12-18 MED FILL — POLYETHYLENE GLYCOL 3350 PO: 90 days supply | Qty: 3162 | Fill #0

## 2015-12-18 MED FILL — ESZOPICLONE 1 MG TABLET: 1 | 70 days supply | Qty: 20 | Fill #0

## 2015-12-18 NOTE — Patient Instructions (Signed)

## 2015-12-18 NOTE — Progress Notes (Signed)
Patient ID: Brooke [name], female   DOB: Dec 30, 1975, 40 y.o.   MRN: [id number]   Subjective:  This history was provided by the patient.  Brooke [name] is a 40 y.o. female who  has a past medical history of Asthma.. Patient presents to clinic for painful hemorrhoids.  Patient has had intermittent issues with hemorrhoids since the birth of her 39 year-old twins.  Over the past year patient has had increased episodes with hemorrhoids that she can feel around her rectum.  Patient also has ongoing issues with constipation and takes a fiber supplement daily and uses Miralax intermittently to combat that.  Although patient states her water intake is adequate, she often strains to have a BM.  Patient states when she has a hemorrhoid she occasionally notices bright red blood on the toilet paper when she wipes, but denies gross blood in toilet and melena.  Patient states she does not currently have a hemorrhoid and the most recent one resolved last week. Rectal pain and bleeding is not present unless hemorrhoid is present.  Patient denies abdominal pain/distension, N/V/D and fever.   Review of Systems:  Per HPI. All other systems reviewed and are negative.   PMH, PSH, Medications, Allergies, and FmHx reviewed and updated in EMR.  Social History: never smoker  Objective:  BP 131/73 mmHg  Pulse 86  Temp(Src) 98.7 F (37.1 C) (Oral)  Ht _0  (1.676 m)  Wt 175 lb 9.6 oz (79.652 kg)  BMI 28.36 kg/m2  LMP 11/20/2015  General: 40 y.o. female Awake, alert, well-nourished, NAD and non-toxic in appearance Cardio: RRR, S1S2 heard, no murmurs appreciated, no LE edema, +2 radial and dorsalis pedis pulses bilaterally Pulm: Clear to auscultation bilaterally, no wheezes, rhonchi or rales GI: Soft, not tender, no distension,+BS x4, no hepatomegaly, no splenomegaly Neuro: Strength and sensation grossly intact   Assessment & Plan:  BRITTANI [name] is a 40 y.o. female here for intermittent hemorrhoids.  This  has been an ongoing issue for the past 15 years with increasing frequency over the past year.  Patient also struggles with constipation which makes the incidents of hemorrhoids worse as she strains to have BMs.  A routine bowel regimen that alleviates the patient's need to strain as she has a BM would likely lesson the frequency of hemorrhoids.   Patient also mentions occasional insomnia. She had a Lunesta prescription filled last year and requested a refill.    1. Hemorrhoids, unspecified hemorrhoid type - Lidocaine-Hydrocortisone Ace 2-2 % KIT; Place 1 application rectally 2 (two) times daily as needed when hemorrhoids flare. - Polyethylene glycol powder (GLYCOLAX/MIRALAX) powder; Take 17 g by mouth 2 (two) times daily as needed (titrate to 1-2 soft bowel movements daily, no straining). - Increase intake of fruits and vegetables - Maintain adequate water intake   2. Insomnia - eszopiclone (LUNESTA) 1 MG TABS tablet; Take 1 tablet (1 mg total) by mouth at bedtime as needed for sleep. Take immediately before bedtime. Do not take more than 2 times per week.  Dispense: 20 tablet; Refill: Speculator, NP [facility] 12/18/2015 5:19 PM

## 2016-05-30 ENCOUNTER — Ambulatory Visit (HOSPITAL_COMMUNITY)
Admission: EM | Admit: 2016-05-30 | Discharge: 2016-05-30 | Disposition: A | Discharge status: Home / Self Care | Payer: 59 | Attending: Family Medicine | Admitting: Family Medicine

## 2016-05-30 ENCOUNTER — Encounter (HOSPITAL_COMMUNITY): Payer: Self-pay | Admitting: Emergency Medicine

## 2016-05-30 DIAGNOSIS — W57XXXA Bitten or stung by nonvenomous insect and other nonvenomous arthropods, initial encounter: Secondary | ICD-10-CM

## 2016-05-30 DIAGNOSIS — T148 Other injury of unspecified body region: Secondary | ICD-10-CM | POA: Diagnosis not present

## 2016-05-30 MED ORDER — HYDROXYZINE HCL 25 MG PO TABS: 25.0000 mg | ORAL_TABLET | Freq: Four times a day (QID) | ORAL | Status: DC

## 2016-05-30 MED ORDER — PREDNISONE 10 MG PO TABS: 20.0000 mg | ORAL_TABLET | Freq: Every day | ORAL | Status: DC

## 2016-05-30 NOTE — ED Notes (Signed)
PT has an insect bite to left upper arm. PT was at the park at 7pm last night and her upper arm started to burn and hurt. PT did not see what bit or stung her. PT's underside of left upper arm is red, warm, and swollen.

## 2016-05-30 NOTE — Discharge Instructions (Signed)
Bee, Wasp, or [employer], wasps, and hornets are part of a family of insects that can sting people. These stings can cause pain and inflammation, but they are usually not serious. However, some people may have an allergic reaction to a sting. This can cause the symptoms to be more severe.  SYMPTOMS  Common symptoms of this condition include:   A red lump in the skin that sometimes has a tiny hole in the center. In some cases, a stinger may be in the center of the wound.  Pain and itching at the sting site.  Redness and swelling around the sting site. If you have an allergic reaction (localized allergic reaction), the swelling and redness may spread out from the sting site. In some cases, this reaction can continue to develop over the next 12-36 hours. In rare cases, a person may have a severe allergic reaction (anaphylactic reaction) to a sting. Symptoms of an anaphylactic reaction may include:   Wheezing or difficulty breathing.  Raised, itchy, red patches on the skin.  Nausea or vomiting.  Abdominal cramping.  Diarrhea.  Chest pain.  Fainting.  Redness of the face (flushing). DIAGNOSIS  This condition is usually diagnosed based on symptoms, medical history, and a physical exam. TREATMENT  Most stings can be treated with:   Icing to reduce swelling.  Medicines (antihistamines) to treat itching or an allergic reaction.  Medicines to help reduce pain. These may be medicines that you take by mouth, or medicated creams or lotions that you apply to your skin. If you were stung by a bee, the stinger and a small sac of poison may be in the wound. This may be removed by brushing across it with a flat card, such as a credit card. Another method is to pinch the area and pull it out. These methods can help reduce the severity of the body's reaction to the sting.  HOME CARE INSTRUCTIONS   Wash the sting site daily with soap and water as told by your health care provider.  Apply  or take over-the-counter and prescription medicines only as told by your health care provider.  If directed, apply ice to the sting area.  Put ice in a plastic bag.  Place a towel between your skin and the bag.  Leave the ice on for 20 minutes, 2-3 times per day.  Do not scratch the sting area.  To lessen pain, try using a paste that is made of water and baking soda. Rub the paste on the sting area and leave it on for 5 minutes.  If you had a severe allergic reaction to a sting, you may need:  To wear a medical bracelet or necklace that lists the allergy.  To learn when and how to use an anaphylaxis kit or epinephrine injection. Your family members may also need to learn this.  To carry an anaphylaxis kit with you at all times. SEEK MEDICAL CARE IF:   Your symptoms do not get better in 2-3 days.  You have redness, swelling, or pain that spreads beyond the area of the sting.  You have a fever. SEEK IMMEDIATE MEDICAL CARE IF:  You have symptoms of a severe allergic reaction. These include:   Wheezing or difficulty breathing.  Chest pain.  Light-headedness or fainting.  Itchy, raised, red patches on the skin.  Nausea or vomiting.  Abdominal cramping.  Diarrhea.   This information is not intended to replace advice given to you by your health care provider.  Make sure you discuss any questions you have with your health care provider. °  °Document Released: 10/26/2005 Document Revised: 07/17/2015 Document Reviewed: 03/13/2015 °Elsevier Interactive Patient Education ©2016 Elsevier Inc. ° °

## 2016-05-31 NOTE — ED Provider Notes (Signed)
CSN: [id number]     Arrival date & time Brooke/22/17  1501 History   First MD Initiated Contact with Patient 05/30/16 1549     Chief Complaint  Patient presents with  . Insect Bite   (Consider location/radiation/quality/duration/timing/severity/associated sxs/prior Treatment) HPI 40 Y/O [name] STATES SHE WAS STUNG YESTERDAY AND TODAY SWOLLEN TENDER ARM. HAS USED BENADRYL WITHOUT RELIEF. NO RESPIRATORY SYMPTOMS. PAIN SCORE IS 4 THROBBIG SHOOTING PAIN.  Past Medical History:  Diagnosis Date  . Asthma    Past Surgical History:  Procedure Laterality Date  . BTL    . TUBAL LIGATION Bilateral    Family History  Problem Relation Age of Onset  . Hypertension Maternal Aunt   . Diabetes Maternal Grandmother    Social History  Substance Use Topics  . Smoking status: Never Smoker  . Smokeless tobacco: Not on file  . Alcohol use Yes     Comment: social   OB History    No data available     Review of Systems  Denies: HEADACHE, NAUSEA, ABDOMINAL PAIN, CHEST PAIN, CONGESTION, DYSURIA, SHORTNESS OF BREATH  Allergies  Penicillins  Home Medications   Prior to Admission medications   Medication Sig Start Date End Date Taking? Authorizing Provider  albuterol (PROVENTIL HFA;VENTOLIN HFA) 108 (90 BASE) MCG/ACT inhaler Inhale 2 puffs into the lungs every 6 (six) hours as needed for wheezing or shortness of breath. 11/27/14   Frazier Richards, MD  eszopiclone (LUNESTA) 1 MG TABS tablet Take 1 tablet (1 mg total) by mouth at bedtime as needed for sleep. Take immediately before bedtime. Do not take more than 2 times per week. 12/18/15   Frazier Richards, MD  hydrOXYzine (ATARAX/VISTARIL) 25 MG tablet Take 1 tablet (25 mg total) by mouth every 6 (six) hours. Brooke/22/17   Konrad Felix, PA  Lidocaine-Hydrocortisone Ace 2-2 % KIT Place 1 application rectally 2 (two) times daily as needed (hemorrhoids). 12/18/15   Frazier Richards, MD  meloxicam (MOBIC) 15 MG tablet Take 1 tablet (15 mg total) by mouth daily. x Brooke  days then daily as needed 08/08/15   Leeanne Rio, MD  metroNIDAZOLE (FLAGYL) 500 MG tablet Take 1 tablet (500 mg total) by mouth 2 (two) times daily. 08/08/15   Leeanne Rio, MD  polyethylene glycol powder (GLYCOLAX/MIRALAX) powder Take 17 g by mouth 2 (two) times daily as needed (titrate to 1-2 soft bowel movements daily, no straining). 12/18/15   Frazier Richards, MD  predniSONE (DELTASONE) 10 MG tablet Take 2 tablets (20 mg total) by mouth daily. Brooke/22/17   Konrad Felix, PA  traMADol (ULTRAM) 50 MG tablet Take 1 tablet (50 mg total) by mouth every 8 (eight) hours as needed for severe pain. 08/08/15   Leeanne Rio, MD   Meds Ordered and Administered this Visit  Medications - No data to display  BP 125/79 (BP Location: Right Arm)   Pulse 74   Temp 99 F (37.2 C) (Oral)   Resp 16   LMP 05/19/2016   SpO2 100%  No data found.   Physical Exam NURSES NOTES AND VITAL SIGNS REVIEWED. CONSTITUTIONAL: Well developed, well nourished, no acute distress HEENT: normocephalic, atraumatic EYES: Conjunctiva normal NECK:normal ROM, supple, no adenopathy PULMONARY:No respiratory distress, normal effort ABDOMINAL: Soft, ND, NT BS+, No CVAT MUSCULOSKELETAL: Normal ROM of all extremities, RIGHT UPPER RM RED, 5 CM TENDER AREA NOT FLUCTUANT.   SKIN: warm and dry without rash PSYCHIATRIC: Mood and affect, behavior are normal  Urgent Care Course   Clinical Course    Procedures (including critical care time)  Labs Review Labs Reviewed - No data to display  Imaging Review No results found.   Visual Acuity Review  Right Eye Distance:   Left Eye Distance:   Bilateral Distance:    Right Eye Near:   Left Eye Near:    Bilateral Near:       RX FOR KEFLEX,PREDNISONE  MDM   1. Bug bite     Patient is reassured that there are no issues that require transfer to higher level of care at this time or additional tests. Patient is advised to continue home symptomatic  treatment. Patient is advised that if there are new or worsening symptoms to attend the emergency department, contact primary care provider, or return to UC. Instructions of care provided discharged home in stable condition.    THIS NOTE WAS GENERATED USING A VOICE RECOGNITION SOFTWARE PROGRAM. ALL REASONABLE EFFORTS  WERE MADE TO PROOFREAD THIS DOCUMENT FOR ACCURACY.  I have verbally reviewed the discharge instructions with the patient. A printed AVS was given to the patient.  All questions were answered prior to discharge.      Konrad Felix, PA 05/31/16 1001

## 2016-06-12 ENCOUNTER — Other Ambulatory Visit: Payer: Self-pay | Admitting: *Deleted

## 2016-06-12 MED ORDER — ALBUTEROL SULFATE HFA 108 (90 BASE) MCG/ACT IN AERS: 2.0000 | INHALATION_SPRAY | Freq: Four times a day (QID) | RESPIRATORY_TRACT | 2 refills | Status: DC | PRN

## 2016-06-12 MED FILL — VENTOLIN HFA 90 MCG INHALER: 108 (90 BAS | 25 days supply | Qty: 18 | Fill #0

## 2016-06-18 ENCOUNTER — Ambulatory Visit (INDEPENDENT_AMBULATORY_CARE_PROVIDER_SITE_OTHER): Payer: 59 | Admitting: Internal Medicine

## 2016-06-18 DIAGNOSIS — R05 Cough: Secondary | ICD-10-CM | POA: Diagnosis not present

## 2016-06-18 DIAGNOSIS — R059 Cough, unspecified: Secondary | ICD-10-CM | POA: Insufficient documentation

## 2016-06-18 MED ORDER — FLUTICASONE PROPIONATE 50 MCG/ACT NA SUSP: 2.0000 | Freq: Every day | NASAL | 1 refills | Status: DC

## 2016-06-18 MED ORDER — OXYMETAZOLINE HCL 0.05 % NA SOLN: 2.0000 | Freq: Two times a day (BID) | NASAL | 0 refills | Status: DC

## 2016-06-18 MED FILL — FLUTICASONE PROP 50 MCG SPR: 50 | 30 days supply | Qty: 16 | Fill #0

## 2016-06-18 NOTE — Progress Notes (Signed)
   Subjective:    Brooke AhmadiBrandy V [name] - 40 y.o. female MRN [id number]  Date of birth: 14-Aug-1976  HPI  Brooke AhmadiBrandy V Trippe is here for nasal congestion and cough.  COUGH  Has been coughing for 3 days. Cough is: productive occasionally but mostly dry.  Sputum production: yellow phlegm  Medications tried: No  Taking blood pressure medications: No   Symptoms Runny nose: Yes and nasal congestion  Mucous in back of throat: No but throat feels dry  Throat burning or reflux: No  Wheezing or asthma: H/o asthma. Has been using Albuterol the past two days BID. Usually doesn't use it at all. Has felt SOB but no wheezing.  Fever: No  Chest Pain: No  Shortness of breath: Yes  Leg swelling: No  Hemoptysis: No  Weight loss: No   -  reports that she has never smoked. She does not have any smokeless tobacco history on file. - Review of Systems: Per HPI. - Past Medical History: Patient Active Problem List   Diagnosis Date Noted  . Cough 06/18/2016  . Hemorrhoids 12/18/2015  . Chronic low back pain 08/09/2015  . Insomnia 05/22/2014  . Well woman exam with routine gynecological exam 05/22/2014   - Medications: reviewed and updated    Objective:   Physical Exam BP 140/90 (BP Location: Right Arm, Patient Position: Sitting, Cuff Size: Normal)   Pulse 87   Temp 98.1 F (36.7 C) (Oral)   Ht 5\' 6"  (1.676 m)   Wt 183 lb 12.8 oz (83.4 kg)   LMP 06/15/2016   SpO2 100%   BMI 29.67 kg/m  Gen: NAD, alert, cooperative with exam, non-toxic but appears sick  HEENT: NCAT, PERRL, clear conjunctiva, oropharynx erythematous, turbinates edematous and erythematous bilaterally R >L, TM normal, no TTP over maxillary or frontal sinuses  CV: RRR, good S1/S2, no murmur Resp: CTABL, no wheezes, non-labored    Assessment & Plan:   Cough Cough with nasal congestion. Given time course suspect patient has viral URI. Although she does have history of asthma and has been using albuterol, lung exam is normal and O2  saturation 100% so low suspicion for acute asthma exacerbation.  -Rx for flonase given  -recommended short course of Afrin -recommended Sudafed -rest and hydration -work note given  -return precautions of respiratory distress discussed     Marcy Sirenatherine Makaylin Carlo, D.O. 06/18/2016, 12:03 PM PGY-2, [facility]

## 2016-06-18 NOTE — Assessment & Plan Note (Addendum)
Cough with nasal congestion. Given time course suspect patient has viral URI. Although she does have history of asthma and has been using albuterol, lung exam is normal and O2 saturation 100% so doubt acute asthma exacerbation.  -Rx for flonase given  -recommended short course of Afrin -recommended Sudafed -rest and hydration -work note given  -return precautions of respiratory distress discussed

## 2016-06-18 NOTE — Patient Instructions (Addendum)
Start using the [employer] IndustriesFlonase everyday. Also use the Afrin starting today. Do not use the Afrin for more than 3 days because after 3 days it can cause rebound congestion.   If these nasal sprays do not help, you can also try Sudafed.   Take Ibuprofen for aches and pains. Get plenty of rest and stay well hydrated!   If you start having worsening shortness of breath not improved with albuterol or have lips/toes/fingers that turn blue please seek medical care.   Feel better!   Dr. Earlene PlaterWallace

## 2016-10-21 ENCOUNTER — Ambulatory Visit: Payer: 59 | Admitting: Family Medicine

## 2016-10-26 ENCOUNTER — Ambulatory Visit (INDEPENDENT_AMBULATORY_CARE_PROVIDER_SITE_OTHER): Payer: 59 | Admitting: Family Medicine

## 2016-10-26 ENCOUNTER — Other Ambulatory Visit (HOSPITAL_COMMUNITY)
Admission: RE | Admit: 2016-10-26 | Discharge: 2016-10-26 | Disposition: A | Discharge status: Home / Self Care | Payer: 59 | Source: Ambulatory Visit | Attending: Family Medicine | Admitting: Family Medicine

## 2016-10-26 VITALS — BP 125/80 | HR 81 | Temp 98.7°F | Ht 66.0 in | Wt 179.0 lb

## 2016-10-26 DIAGNOSIS — Z113 Encounter for screening for infections with a predominantly sexual mode of transmission: Secondary | ICD-10-CM | POA: Insufficient documentation

## 2016-10-26 DIAGNOSIS — Z20828 Contact with and (suspected) exposure to other viral communicable diseases: Secondary | ICD-10-CM

## 2016-10-26 DIAGNOSIS — Z202 Contact with and (suspected) exposure to infections with a predominantly sexual mode of transmission: Secondary | ICD-10-CM | POA: Diagnosis not present

## 2016-10-26 DIAGNOSIS — N898 Other specified noninflammatory disorders of vagina: Secondary | ICD-10-CM

## 2016-10-26 LAB — POCT WET PREP (WET MOUNT)
CLUE CELLS WET PREP WHIFF POC: POSITIVE
Trichomonas Wet Prep HPF POC: ABSENT

## 2016-10-26 MED ORDER — FLUCONAZOLE 150 MG PO TABS: 150.0000 mg | ORAL_TABLET | Freq: Once | ORAL | 0 refills | Status: AC

## 2016-10-26 MED ORDER — METRONIDAZOLE 500 MG PO TABS: 500.0000 mg | ORAL_TABLET | Freq: Two times a day (BID) | ORAL | 0 refills | Status: DC

## 2016-10-26 MED FILL — metroNIDAZOLE 500 MG TABS: 500 | 7 days supply | Qty: 14 | Fill #0

## 2016-10-26 MED FILL — FLUCONAZOLE 150 MG TABLET: 150 | 1 days supply | Qty: 1 | Fill #0

## 2016-10-26 NOTE — Assessment & Plan Note (Signed)
Patient is here with complaints of long-standing vaginal discharge. She has a history of BV in the past. Rectal exam yielded no lesions or ulcerations. Wet prep yielded BV and yeast. - Metronidazole twice a day 7 days - Diflucan once - Discussed return precautions - Vaginal hygiene handout provided. - HIV, RPR obtained and pending. (Discussed that I will mail these results if negative and call if there is anything concerning to discuss)

## 2016-10-26 NOTE — Progress Notes (Signed)
VAGINAL DISCHARGE Been going on for months. Most concerned about odor. Was seen in Sept. 2016 for similar issues. Treated at that time with Flagyl. Some temp relief but then sxs recurred.   Having vaginal discharge for 3-4 months. Discharge is: yellow, thick, malodorous Sex in last month: yes Using barrier protection (condoms): no Possible STD exposure: unknown Personal history of vaginal infection: treated for trich "many years ago" Family history of uterine or vaginal cancer: no Recent antibiotic use: no  Symptoms Vaginal itching: no Dysuria: no Dyspareunia: no Genital sores or ulcers:no Hematuria: no Flank pain: yes, but this is chronic Weight loss: no Weight gain: no Trouble with vision: no Headaches: no Abdomen or pelvic pain: no Back pain: chronic (unchanged from baseline per patient)   ROS see HPI Smoking Status noted   CC, SH/smoking status, and VS noted  Objective: BP 125/80 (BP Location: Left Arm, Patient Position: Sitting, Cuff Size: Normal)   Pulse 81   Temp 98.7 F (37.1 C) (Oral)   Ht 5\' 6"  (1.676 m)   Wt 179 lb (81.2 kg)   SpO2 99%   BMI 28.89 kg/m  Gen: NAD, alert, cooperative. CV: Well-perfused. RRR, no murmur Resp: Non-labored. CTAB. No wheezes/crackles Neuro: Sensation intact throughout. Female genitalia: Vulva: normal appearing vulva with no masses, tenderness or lesions Vagina: vaginal erythema generally and Discharge present Cervix: cervical discharge present - creamy, bloody, malodorous and thick, cervical motion tenderness absent and lesions absent   Assessment and plan:  Vaginal discharge Patient is here with complaints of long-standing vaginal discharge. She has a history of BV in the past. Rectal exam yielded no lesions or ulcerations. Wet prep yielded BV and yeast. - Metronidazole twice a day 7 days - Diflucan once - Discussed return precautions - Vaginal hygiene handout provided. - HIV, RPR obtained and pending. (Discussed that  I will mail these results if negative and call if there is anything concerning to discuss)   Orders Placed This Encounter  Procedures  . HIV antibody  . RPR  . POCT Wet Prep [facility]([place] [place])    Meds ordered this encounter  Medications  . metroNIDAZOLE (FLAGYL) 500 MG tablet    Sig: Take 1 tablet (500 mg total) by mouth 2 (two) times daily.    Dispense:  14 tablet    Refill:  0  . fluconazole (DIFLUCAN) 150 MG tablet    Sig: Take 1 tablet (150 mg total) by mouth once.    Dispense:  1 tablet    Refill:  0     Kathee DeltonIan D Maven Varelas, MD,MS,  PGY3 10/26/2016 3:46 PM

## 2016-10-26 NOTE — Patient Instructions (Addendum)
It was a pleasure seeing you today in our clinic. Today we discussed your vaginal discharge. Here is the treatment plan we have discussed and agreed upon together:   - I prescribed you metronidazole. Take 1 tablet twice a day for 7 days. Make sure to take this prescription to completion. Avoid any and all alcohol use while on this medication as it can cause severe nausea and vomiting. - I prescribed to you Diflucan. Take this tablet once. If you can wait 7-10 days before taking this medication would be ideal as the prolonged metronidazole course can put you at greater risk for developing a yeast infection.   Healthy vaginal hygiene practices   -  Avoid sleeper pajamas. Nightgowns allow air to circulate.  Sleep without underpants whenever possible.  -  Wear cotton underpants during the day. Double-rinse underwear after washing to avoid residual detergent. Do not use fabric softeners for underwear and swimsuits.  - Avoid tights, leotards, leggings, "skinny" jeans, and other tight-fitting clothing. Skirts and loose-fitting pants allow air to circulate.  - Avoid pantyliners.  Instead use tampons or cotton pads.  - Daily warm bathing is helpful:     - Soak in clean water (no soap) for 10 to 15 minutes. Adding vinegar or baking soda to the water has not been specifically studied and may not be better than clean water alone.      - Use soap to wash regions other than the genital area just before getting out of the tub. Limit use of any soap on genital areas. Use fragance-free soaps.     - Rinse the genital area well and gently pat dry.  Don't rub.  Hair dryer to assist with drying can be used only if on cool setting.     - Do not use bubble baths or perfumed soaps.  - Do not use any feminine sprays, douches or powders.  These contain chemicals that will irritate the skin.  - If the genital area is tender or swollen, cool compresses may relieve the discomfort. Unscented wet wipes can be used instead  of toilet paper for wiping.   - Emollients, such as Vaseline, may help protect skin and can be applied to the irritated area.  - Always remember to wipe front-to-back after bowel movements. Pat dry after urination.  - Do not sit in wet swimsuits for long periods of time after swimming

## 2016-10-27 ENCOUNTER — Encounter: Payer: Self-pay | Admitting: Family Medicine

## 2016-10-27 LAB — CERVICOVAGINAL ANCILLARY ONLY
Chlamydia: NEGATIVE
NEISSERIA GONORRHEA: NEGATIVE

## 2016-10-27 LAB — RPR

## 2016-10-27 LAB — HIV ANTIBODY (ROUTINE TESTING W REFLEX): HIV 1&2 Ab, 4th Generation: NONREACTIVE

## 2016-11-17 ENCOUNTER — Encounter: Payer: Self-pay | Admitting: Internal Medicine

## 2016-11-17 ENCOUNTER — Ambulatory Visit (INDEPENDENT_AMBULATORY_CARE_PROVIDER_SITE_OTHER): Payer: 59 | Admitting: Internal Medicine

## 2016-11-17 VITALS — BP 116/70 | HR 90 | Temp 98.3°F | Ht 66.0 in | Wt 177.0 lb

## 2016-11-17 DIAGNOSIS — N3000 Acute cystitis without hematuria: Secondary | ICD-10-CM

## 2016-11-17 LAB — POCT URINALYSIS DIPSTICK
BILIRUBIN UA: NEGATIVE
Glucose, UA: NEGATIVE
Ketones, UA: NEGATIVE
NITRITE UA: POSITIVE
Protein, UA: 100
Spec Grav, UA: >=1.03
Urobilinogen, UA: 0.2
pH, UA: 6

## 2016-11-17 MED ORDER — CIPROFLOXACIN HCL 250 MG PO TABS: 250.0000 mg | ORAL_TABLET | Freq: Two times a day (BID) | ORAL | 0 refills | Status: DC

## 2016-11-17 MED ORDER — CIPROFLOXACIN HCL 250 MG PO TABS
250.0000 mg | ORAL_TABLET | Freq: Two times a day (BID) | ORAL | 0 refills | Status: DC
Start: 2016-11-17 — End: 2016-12-29

## 2016-11-17 MED FILL — CIPROFLOXACIN HCL 250 MG TA: 250 | 5 days supply | Qty: 10 | Fill #0

## 2016-11-17 NOTE — Progress Notes (Signed)
   [facility] Phone: [phone]   Date of Visit: 11/17/2016   HPI:  Brooke AhmadiBrandy V [name] is a 41 y.o. female presenting to clinic today for same day appointment. PCP: Mickie HillierIan McKeag, MD Concerns today include: possible UTI - no dysuria - sharp pain with uriantion started yesterday; also notes of frequency and hesitancy - no fever - no flank pain  - no n/v - no history of kidney stones - is sexually active; new partner for the past few months. uses condoms regularly  - no vaginal discharge. Reports she was tested for STI last month.  - has a tubal ligation - has history of UTI but last one was "years ago"  ROS: See HPI.  PMFSH:  PMH reviewed  Penicillin Allergy  PHYSICAL EXAM: BP 116/70   Pulse 90   Temp 98.3 F (36.8 C) (Oral)   Ht 5\' 6"  (1.676 m)   Wt 177 lb (80.3 kg)   LMP 11/06/2016   BMI 28.57 kg/m  GEN: NAD CV: RRR, no murmurs, rubs, or gallops PULM: CTAB, normal effort ABD: Soft, suprapubic tenderness noted , nondistended, NABS, no organomegaly. No CVA tenderness SKIN: No rash or cyanosis; warm and well-perfused PSYCH: Mood and affect euthymic, normal rate and volume of speech NEURO: Awake, alert, no focal deficits grossly, normal speech   ASSESSMENT/PLAN: 1. Acute cystitis without hematuria UA with Nitrites and leukocyte esterase consistent with uncomplicated acute cystitis.  Prior urine culture pan-sensitive. Prescribe Ciprofloxacin 250mg  BID x 5 days. Return precautions discussed. Follow up if symptoms worsen or do not improve with medication.  - Urinalysis Dipstick - Urine culture  Palma HolterKanishka G Wateen Varon, MD PGY 2 [facility]

## 2016-11-17 NOTE — Patient Instructions (Addendum)
For your UTI, please take Ciprofloxacin 1 tablet twice a day for 5 days. Please let us know if your symptoms do not resolve or worsen despite the medication.

## 2016-11-19 ENCOUNTER — Ambulatory Visit: Payer: 59 | Admitting: Internal Medicine

## 2016-11-19 LAB — URINE CULTURE: Culture: >=100000 — AB

## 2016-11-23 NOTE — Progress Notes (Signed)
Cancelling order as lab not collected, no need to re-order 

## 2016-12-10 ENCOUNTER — Encounter: Payer: Self-pay | Admitting: Internal Medicine

## 2016-12-10 ENCOUNTER — Ambulatory Visit (INDEPENDENT_AMBULATORY_CARE_PROVIDER_SITE_OTHER): Payer: 59 | Admitting: Internal Medicine

## 2016-12-10 DIAGNOSIS — F5105 Insomnia due to other mental disorder: Secondary | ICD-10-CM | POA: Diagnosis not present

## 2016-12-10 DIAGNOSIS — F409 Phobic anxiety disorder, unspecified: Secondary | ICD-10-CM

## 2016-12-10 MED ORDER — TRAZODONE HCL 50 MG PO TABS: 25.0000 mg | ORAL_TABLET | Freq: Every evening | ORAL | 3 refills | Status: DC | PRN

## 2016-12-10 MED FILL — traZODone HCL 50 MG TABS: 50 | 20 days supply | Qty: 20 | Fill #0

## 2016-12-10 NOTE — Progress Notes (Signed)
   Subjective:   Patient: Brooke [name]       Birthdate: August 30, 1976       MRN: [id number]      HPI  Brooke [name] is a 41 y.o. female presenting for insomnia.   Insomnia Ongoing for the past few years. Patient was previously prescribed Lunesta starting in 2015. She has been taking one 1mg  tablet about twice a week since then. [facility] a "sleeping aid" from [facility] (likely melatonin). Patient reports difficulty falling asleep and staying asleep. She sleeps better on days when she has exercised. She typically watches TV in bed before going to sleep, and leaves the TV on while she is sleeping at night because she likes the background noise. She does not drink any caffeine. She typically goes to bed between 10-11PM and wakes up at 6:15AM. She wakes up slightly later on the weekends when she does not have to work. She denies any side effects of Lunesta.   Smoking status reviewed. Patient is never smoker.   Review of Systems See HPI.     Objective:  Physical Exam  Constitutional: She is oriented to person, place, and time and well-developed, well-nourished, and in no distress.  HENT:  Head: Normocephalic and atraumatic.  Eyes: Conjunctivae and EOM are normal. Right eye exhibits no discharge. Left eye exhibits no discharge.  Pulmonary/Chest: Effort normal. No respiratory distress.  Abdominal: Soft. She exhibits no distension.  Neurological: She is alert and oriented to person, place, and time.  Skin: Skin is warm and dry.  Psychiatric: Affect and judgment normal.      Assessment & Plan:  Insomnia Discussed with patients concerns about side effects of Lunesta, and recommendation to switch to a different sleep aid. Patient amenable to this. Also discussed multiple non-pharmacological methods to improve sleep, including no screens 30 minutes before bed, not watching TV in bed, using a white noise machine/app for background noise rather than TV, avoiding  caffeine (as she already does), and drinking Sleepytime Tea before bed. Patient appreciative of this information. Patient agreeable to trying low dose of trazodone in place of Lunesta.  - Trazodone 25-50mg  qhs PRN. To be used no more than twice per week.  - Patient to consider melatonin as well  Tarri AbernethyAbigail J Dusti Tetro, MD, MPH PGY-2 [facility] Pager [phone]

## 2016-12-10 NOTE — Assessment & Plan Note (Signed)
Discussed with patients concerns about side effects of Lunesta, and recommendation to switch to a different sleep aid. Patient amenable to this. Also discussed multiple non-pharmacological methods to improve sleep, including no screens 30 minutes before bed, not watching TV in bed, using a white noise machine/app for background noise rather than TV, avoiding caffeine (as she already does), and drinking Sleepytime Tea before bed. Patient appreciative of this information. Patient agreeable to trying low dose of trazodone in place of Lunesta.  - Trazodone 25-50mg  qhs PRN. To be used no more than twice per week.  - Patient to consider melatonin as well

## 2016-12-10 NOTE — Patient Instructions (Addendum)
It was nice meeting you today Ms. Brooke [name]!  You can take one-half to one tablet of trazodone as needed for sleep. You can also buy melatonin from the drugstore which is safe to take every night to help with sleep.   Sleepytime Tea by [employer] may also help you fall asleep. The "Calm App" has the bedtime stories and the background noises we discussed.   Finally, I encourage you to not watch TV in bed. It is recommended not to look at any screen for 30 minutes before bed, but if you want to watch TV right before bed, you could try watching it on the couch then getting into bed when you feel sleepy.   If you have any questions or concerns, please feel free to call the clinic.   Be well,  Dr. Natale MilchLancaster

## 2016-12-29 ENCOUNTER — Ambulatory Visit (INDEPENDENT_AMBULATORY_CARE_PROVIDER_SITE_OTHER): Payer: 59 | Admitting: Internal Medicine

## 2016-12-29 ENCOUNTER — Encounter: Payer: Self-pay | Admitting: Internal Medicine

## 2016-12-29 ENCOUNTER — Other Ambulatory Visit (HOSPITAL_COMMUNITY)
Admission: RE | Admit: 2016-12-29 | Discharge: 2016-12-29 | Disposition: A | Discharge status: Home / Self Care | Payer: 59 | Source: Ambulatory Visit | Attending: Family Medicine | Admitting: Family Medicine

## 2016-12-29 VITALS — BP 114/60 | HR 93 | Temp 98.9°F | Ht 66.0 in | Wt 183.0 lb

## 2016-12-29 DIAGNOSIS — B373 Candidiasis of vulva and vagina: Secondary | ICD-10-CM | POA: Diagnosis not present

## 2016-12-29 DIAGNOSIS — B9689 Other specified bacterial agents as the cause of diseases classified elsewhere: Secondary | ICD-10-CM | POA: Diagnosis not present

## 2016-12-29 DIAGNOSIS — N760 Acute vaginitis: Secondary | ICD-10-CM | POA: Diagnosis not present

## 2016-12-29 DIAGNOSIS — Z113 Encounter for screening for infections with a predominantly sexual mode of transmission: Secondary | ICD-10-CM | POA: Insufficient documentation

## 2016-12-29 DIAGNOSIS — N898 Other specified noninflammatory disorders of vagina: Secondary | ICD-10-CM

## 2016-12-29 DIAGNOSIS — B3731 Acute candidiasis of vulva and vagina: Secondary | ICD-10-CM

## 2016-12-29 LAB — POCT WET PREP (WET MOUNT)
CLUE CELLS WET PREP WHIFF POC: NEGATIVE
Trichomonas Wet Prep HPF POC: ABSENT

## 2016-12-29 MED ORDER — METRONIDAZOLE 500 MG PO TABS: 500.0000 mg | ORAL_TABLET | Freq: Two times a day (BID) | ORAL | 0 refills | Status: DC

## 2016-12-29 MED ORDER — FLUCONAZOLE 150 MG PO TABS: 150.0000 mg | ORAL_TABLET | Freq: Once | ORAL | 0 refills | Status: AC

## 2016-12-29 MED FILL — metroNIDAZOLE 500 MG TABS: 500 | 7 days supply | Qty: 14 | Fill #0

## 2016-12-29 MED FILL — FLUCONAZOLE 150 MG TABLET: 150 | 1 days supply | Qty: 1 | Fill #0

## 2016-12-29 NOTE — Progress Notes (Signed)
   [facility] Phone: [phone]   Date of Visit: 12/29/2016   HPI:  Brooke AhmadiBrandy V [name] is a 41 y.o. female presenting to clinic today for same day appointment. PCP: Mickie HillierIan McKeag, MD Concerns today include:  - vaginal discharge and odor for the past 3 weeks - no abnormal vaginal bleeding. LMP was earlier this month - last sexually active 1 month ago. Is married to her husband - no dysuria or urinary frequency or abdominal pain.  - has been taking probiotic which has not helped  - has episodes of similar symptoms for the past year  - seen in sept 2016 then 10/2016.   ROS: See HPI.  PMFSH:  Hx of cystitis   PHYSICAL EXAM: BP 114/60   Pulse 93   Temp 98.9 F (37.2 C) (Oral)   Ht 5\' 6"  (1.676 m)   Wt 183 lb (83 kg)   LMP 12/19/2016   SpO2 98%   BMI 29.54 kg/m   Gen: NAD Gu: normal external genitalia, vulva, vagina, cervix, uterus and adnexa. Thin white discharge in the vaginal vault.   ASSESSMENT/PLAN:  Vaginal Discharge: wet prep with yeast and clue cells.  - Flagyl 500mg  BID x7days - Diflucan 150mg  x 1  - consider suppressive therapy if recurrs  Palma HolterKanishka G Mariajose Mow, MD PGY 2 [facility]

## 2016-12-30 LAB — CERVICOVAGINAL ANCILLARY ONLY
Chlamydia: NEGATIVE
Neisseria Gonorrhea: NEGATIVE

## 2016-12-31 ENCOUNTER — Telehealth: Payer: Self-pay | Admitting: Internal Medicine

## 2016-12-31 NOTE — Telephone Encounter (Signed)
Please inform patient that gonorrhea and chlamydia testing were negative. Thank you.

## 2016-12-31 NOTE — Telephone Encounter (Signed)
Lm for pt to call back- if pt calls back please tell her her STD tests were negative! Thanks!

## 2016-12-31 NOTE — Telephone Encounter (Signed)
Pt was advised. ep °

## 2017-02-17 ENCOUNTER — Ambulatory Visit (INDEPENDENT_AMBULATORY_CARE_PROVIDER_SITE_OTHER): Payer: 59 | Admitting: Internal Medicine

## 2017-02-17 ENCOUNTER — Encounter: Payer: Self-pay | Admitting: Internal Medicine

## 2017-02-17 DIAGNOSIS — B9789 Other viral agents as the cause of diseases classified elsewhere: Secondary | ICD-10-CM | POA: Diagnosis not present

## 2017-02-17 DIAGNOSIS — J069 Acute upper respiratory infection, unspecified: Secondary | ICD-10-CM

## 2017-02-17 MED ORDER — BENZONATATE 200 MG PO CAPS: 200.0000 mg | ORAL_CAPSULE | Freq: Two times a day (BID) | ORAL | 0 refills | Status: DC | PRN

## 2017-02-17 MED FILL — BENZONATATE 200 MG CAPSULE: 200 | 10 days supply | Qty: 20 | Fill #0

## 2017-02-17 NOTE — Progress Notes (Signed)
   Subjective:   Patient: Brooke [name]       Birthdate: May 13, 1976       MRN: [id number]      HPI  Brooke [name] is a 41 y.o. female present for same day appointment for sore throat and body aches.   Symptoms began four days ago, and have been worsening. Endorses sore throat, cough, subjective fevers, nasal congestion, myalgias, SOB with exertion, headache, and fatigue. No photophobia or phonophobia with headache. Also reports decreased appetite, but has been drinking plenty of fluids. Denies N/V/C/D. Denies sick contacts. No abdominal pain or chest pain. Has been taking OTC cough and cold meds (Nyquil, Robitussin) with no improvement. Has been using Halls cough drops for throat pain. Has history of asthma. Has been using albuterol inhaler with some relief. Denies wheezing. Symptoms are worse at night.   Smoking status reviewed. Patient is never smoker.   Review of Systems See HPI.     Objective:  Physical Exam  Constitutional: She is oriented to person, place, and time and well-developed, well-nourished, and in no distress.  HENT:  Head: Normocephalic and atraumatic.  Right Ear: External ear normal.  Left Ear: External ear normal.  Nose: Nose normal.  Mouth/Throat: Oropharynx is clear and moist. No oropharyngeal exudate.  Eyes: Conjunctivae and EOM are normal. Pupils are equal, round, and reactive to light. Right eye exhibits no discharge. Left eye exhibits no discharge.  Neck: Normal range of motion. Neck supple.  Mild cervical adenopathy  Cardiovascular: Normal rate, regular rhythm and normal heart sounds.   No murmur heard. Pulmonary/Chest: Breath sounds normal. No respiratory distress. She has no wheezes.  Abdominal: Soft. Bowel sounds are normal. She exhibits no distension. There is no tenderness.  Neurological: She is alert and oriented to person, place, and time.  Skin: Skin is warm and dry.  Psychiatric: Affect and judgment normal.      Assessment & Plan:  URI  (upper respiratory infection) Likely viral etiology. Well-appearing and well-hydrated on exam with lungs CTAB. Exam benign other than mild cervical adenopathy. O2 sat 98%. Discussed supportive measures and return precautions.  - Ibuprofen/Tylenol for myalgias and headache - Prescribed Tessalon perles for cough, and recommended honey and elevating head at night   Tarri Abernethy, MD, MPH PGY-2 [facility] Pager [phone]

## 2017-02-17 NOTE — Patient Instructions (Addendum)
It was nice meeting you today Ms. Brooke [name]!  For your cough, you can swallow one Tessalon perle up to every 8 hours as needed. You can also eat a spoonful of honey, either alone or mixed into a warm beverage, 3 or 4 times a day, especially before bed.   For your body aches and headaches, ibuprofen or Tylenol every 4-6 hours as needed can help.   You can also try propping yourself up on a few pillows when you go to bed at night to help with your nighttime symptoms.   Please make sure to drink plenty of fluids to stay well-hydrated.   If you have any questions or concerns, please feel free to call the clinic.   Be well,  Dr. Natale Milch

## 2017-02-17 NOTE — Assessment & Plan Note (Signed)
Likely viral etiology. Well-appearing and well-hydrated on exam with lungs CTAB. Exam benign other than mild cervical adenopathy. O2 sat 98%. Discussed supportive measures and return precautions.  - Ibuprofen/Tylenol for myalgias and headache - Prescribed Tessalon perles for cough, and recommended honey and elevating head at night

## 2017-03-29 ENCOUNTER — Ambulatory Visit (INDEPENDENT_AMBULATORY_CARE_PROVIDER_SITE_OTHER): Payer: 59 | Admitting: Internal Medicine

## 2017-03-29 ENCOUNTER — Encounter: Payer: Self-pay | Admitting: Internal Medicine

## 2017-03-29 VITALS — BP 120/78 | HR 84 | Temp 98.3°F | Ht 66.0 in | Wt 187.0 lb

## 2017-03-29 DIAGNOSIS — N760 Acute vaginitis: Secondary | ICD-10-CM | POA: Diagnosis not present

## 2017-03-29 DIAGNOSIS — B9689 Other specified bacterial agents as the cause of diseases classified elsewhere: Secondary | ICD-10-CM | POA: Diagnosis not present

## 2017-03-29 DIAGNOSIS — N898 Other specified noninflammatory disorders of vagina: Secondary | ICD-10-CM

## 2017-03-29 LAB — POCT WET PREP (WET MOUNT)
CLUE CELLS WET PREP WHIFF POC: POSITIVE
Trichomonas Wet Prep HPF POC: ABSENT

## 2017-03-29 MED ORDER — METRONIDAZOLE 0.75 % EX GEL: CUTANEOUS | 0 refills | Status: DC

## 2017-03-29 MED ORDER — ALBUTEROL SULFATE HFA 108 (90 BASE) MCG/ACT IN AERS
2.0000 | INHALATION_SPRAY | Freq: Four times a day (QID) | RESPIRATORY_TRACT | 2 refills | Status: DC | PRN
Start: 2017-03-29 — End: 2018-03-10

## 2017-03-29 MED FILL — VENTOLIN HFA 90 MCG INHALER: 108 (90 BAS | 25 days supply | Qty: 18 | Fill #0

## 2017-03-29 MED FILL — metroNIDAZOLE 0.75 % GEL: 0.75 | 15 days supply | Qty: 70 | Fill #0

## 2017-03-29 NOTE — Progress Notes (Signed)
   [facility] Phone: [phone]   Date of Visit: 03/29/2017   HPI:  - vaginal discharge and odor for 3 weeks.  - no dysuria, suprapubic tenderness, or urinary frequency - denies douching, use of scented cleansers/lotions in GU area. - sexually active only with husband. History of tubal ligation - no concern for STI - history of recurrent BV with last episode in Feb 2017  ROS: See HPI.  PMFSH:  PMH: History of recurrent BV  PHYSICAL EXAM: BP 120/78   Pulse 84   Temp 98.3 F (36.8 C) (Oral)   Ht 5\' 6"  (1.676 m)   Wt 187 lb (84.8 kg)   LMP 03/21/2017   SpO2 98%   BMI 30.18 kg/m  GEN: NAD CV: RRR, no murmurs, rubs, or gallops PULM: CTAB, normal effort ABD: Soft, nontender, nondistended, NABS, no organomegaly SKIN: No rash or cyanosis; warm and well-perfused GU: Female genitalia: normal external genitalia, vulva, vagina, cervix. Thin white discharge noted on speculum exam.  PSYCH: Mood and affect euthymic, normal rate and volume of speech NEURO: Awake, alert, no focal deficits grossly, normal speech  ASSESSMENT/PLAN:  Bacterial vaginosis, recurrent  Will treat acute symptoms with Metrogel nightly x 5 days then try suppressive therapy- Metrogel twice a week x 5 months.   Palma HolterKanishka G Gunadasa, MD PGY 2 [facility]

## 2017-03-29 NOTE — Assessment & Plan Note (Signed)
Will treat acute symptoms with Metrogel nightly x 5 days then try suppressive therapy- Metrogel twice a week x 5 months.

## 2017-03-29 NOTE — Patient Instructions (Signed)
Please use Metronidazole vaginal gel at bedtime daily for 5 days then twice a week for 5 months.

## 2017-06-02 ENCOUNTER — Encounter: Payer: Self-pay | Admitting: Internal Medicine

## 2017-06-02 ENCOUNTER — Other Ambulatory Visit: Payer: Self-pay | Admitting: Internal Medicine

## 2017-06-04 MED ORDER — METRONIDAZOLE 250 MG PO TABS: ORAL_TABLET | ORAL | 0 refills | Status: DC

## 2017-06-04 MED FILL — metroNIDAZOLE 250 MG TABS: 250 | 56 days supply | Qty: 16 | Fill #0

## 2017-06-04 NOTE — Telephone Encounter (Signed)
We decided on trying twice weekly PO Metronidazole 250mg  for 8 weeks. D/c metrogel.

## 2017-06-08 MED FILL — traZODone HCL 50 MG TABS: 50 | 20 days supply | Qty: 20 | Fill #1

## 2017-07-22 MED FILL — traZODone HCL 50 MG TABS: 50 | 20 days supply | Qty: 20 | Fill #2

## 2017-08-13 DIAGNOSIS — M9901 Segmental and somatic dysfunction of cervical region: Secondary | ICD-10-CM | POA: Diagnosis not present

## 2017-08-13 DIAGNOSIS — M62830 Muscle spasm of back: Secondary | ICD-10-CM | POA: Diagnosis not present

## 2017-08-13 DIAGNOSIS — M546 Pain in thoracic spine: Secondary | ICD-10-CM | POA: Diagnosis not present

## 2017-08-13 DIAGNOSIS — M9902 Segmental and somatic dysfunction of thoracic region: Secondary | ICD-10-CM | POA: Diagnosis not present

## 2017-08-16 DIAGNOSIS — M62830 Muscle spasm of back: Secondary | ICD-10-CM | POA: Diagnosis not present

## 2017-08-16 DIAGNOSIS — M9901 Segmental and somatic dysfunction of cervical region: Secondary | ICD-10-CM | POA: Diagnosis not present

## 2017-08-16 DIAGNOSIS — M9902 Segmental and somatic dysfunction of thoracic region: Secondary | ICD-10-CM | POA: Diagnosis not present

## 2017-08-16 DIAGNOSIS — M546 Pain in thoracic spine: Secondary | ICD-10-CM | POA: Diagnosis not present

## 2017-08-17 DIAGNOSIS — M546 Pain in thoracic spine: Secondary | ICD-10-CM | POA: Diagnosis not present

## 2017-08-17 DIAGNOSIS — M9902 Segmental and somatic dysfunction of thoracic region: Secondary | ICD-10-CM | POA: Diagnosis not present

## 2017-08-17 DIAGNOSIS — M62830 Muscle spasm of back: Secondary | ICD-10-CM | POA: Diagnosis not present

## 2017-08-17 DIAGNOSIS — M9901 Segmental and somatic dysfunction of cervical region: Secondary | ICD-10-CM | POA: Diagnosis not present

## 2017-08-18 DIAGNOSIS — M62830 Muscle spasm of back: Secondary | ICD-10-CM | POA: Diagnosis not present

## 2017-08-18 DIAGNOSIS — M9901 Segmental and somatic dysfunction of cervical region: Secondary | ICD-10-CM | POA: Diagnosis not present

## 2017-08-18 DIAGNOSIS — M9902 Segmental and somatic dysfunction of thoracic region: Secondary | ICD-10-CM | POA: Diagnosis not present

## 2017-08-18 DIAGNOSIS — M546 Pain in thoracic spine: Secondary | ICD-10-CM | POA: Diagnosis not present

## 2017-08-23 DIAGNOSIS — M9901 Segmental and somatic dysfunction of cervical region: Secondary | ICD-10-CM | POA: Diagnosis not present

## 2017-08-23 DIAGNOSIS — M62830 Muscle spasm of back: Secondary | ICD-10-CM | POA: Diagnosis not present

## 2017-08-23 DIAGNOSIS — M9902 Segmental and somatic dysfunction of thoracic region: Secondary | ICD-10-CM | POA: Diagnosis not present

## 2017-08-23 DIAGNOSIS — M546 Pain in thoracic spine: Secondary | ICD-10-CM | POA: Diagnosis not present

## 2017-08-25 DIAGNOSIS — M9901 Segmental and somatic dysfunction of cervical region: Secondary | ICD-10-CM | POA: Diagnosis not present

## 2017-08-25 DIAGNOSIS — M9902 Segmental and somatic dysfunction of thoracic region: Secondary | ICD-10-CM | POA: Diagnosis not present

## 2017-08-25 DIAGNOSIS — M546 Pain in thoracic spine: Secondary | ICD-10-CM | POA: Diagnosis not present

## 2017-08-25 DIAGNOSIS — M62830 Muscle spasm of back: Secondary | ICD-10-CM | POA: Diagnosis not present

## 2017-08-26 DIAGNOSIS — M62830 Muscle spasm of back: Secondary | ICD-10-CM | POA: Diagnosis not present

## 2017-08-26 DIAGNOSIS — M9901 Segmental and somatic dysfunction of cervical region: Secondary | ICD-10-CM | POA: Diagnosis not present

## 2017-08-26 DIAGNOSIS — M9902 Segmental and somatic dysfunction of thoracic region: Secondary | ICD-10-CM | POA: Diagnosis not present

## 2017-08-26 DIAGNOSIS — M546 Pain in thoracic spine: Secondary | ICD-10-CM | POA: Diagnosis not present

## 2017-08-30 DIAGNOSIS — M62830 Muscle spasm of back: Secondary | ICD-10-CM | POA: Diagnosis not present

## 2017-08-30 DIAGNOSIS — M9902 Segmental and somatic dysfunction of thoracic region: Secondary | ICD-10-CM | POA: Diagnosis not present

## 2017-08-30 DIAGNOSIS — M546 Pain in thoracic spine: Secondary | ICD-10-CM | POA: Diagnosis not present

## 2017-08-30 DIAGNOSIS — M9901 Segmental and somatic dysfunction of cervical region: Secondary | ICD-10-CM | POA: Diagnosis not present

## 2017-09-01 DIAGNOSIS — M546 Pain in thoracic spine: Secondary | ICD-10-CM | POA: Diagnosis not present

## 2017-09-01 DIAGNOSIS — M9901 Segmental and somatic dysfunction of cervical region: Secondary | ICD-10-CM | POA: Diagnosis not present

## 2017-09-01 DIAGNOSIS — M62830 Muscle spasm of back: Secondary | ICD-10-CM | POA: Diagnosis not present

## 2017-09-01 DIAGNOSIS — M9902 Segmental and somatic dysfunction of thoracic region: Secondary | ICD-10-CM | POA: Diagnosis not present

## 2017-09-02 DIAGNOSIS — M9901 Segmental and somatic dysfunction of cervical region: Secondary | ICD-10-CM | POA: Diagnosis not present

## 2017-09-02 DIAGNOSIS — M546 Pain in thoracic spine: Secondary | ICD-10-CM | POA: Diagnosis not present

## 2017-09-02 DIAGNOSIS — M62830 Muscle spasm of back: Secondary | ICD-10-CM | POA: Diagnosis not present

## 2017-09-02 DIAGNOSIS — M9902 Segmental and somatic dysfunction of thoracic region: Secondary | ICD-10-CM | POA: Diagnosis not present

## 2017-09-06 DIAGNOSIS — M546 Pain in thoracic spine: Secondary | ICD-10-CM | POA: Diagnosis not present

## 2017-09-06 DIAGNOSIS — M62830 Muscle spasm of back: Secondary | ICD-10-CM | POA: Diagnosis not present

## 2017-09-06 DIAGNOSIS — M9902 Segmental and somatic dysfunction of thoracic region: Secondary | ICD-10-CM | POA: Diagnosis not present

## 2017-09-06 DIAGNOSIS — M9901 Segmental and somatic dysfunction of cervical region: Secondary | ICD-10-CM | POA: Diagnosis not present

## 2017-09-13 MED FILL — traZODone HCL 50 MG TABS: 50 | 20 days supply | Qty: 20 | Fill #3

## 2018-01-06 ENCOUNTER — Ambulatory Visit (INDEPENDENT_AMBULATORY_CARE_PROVIDER_SITE_OTHER): Payer: 59 | Admitting: Family Medicine

## 2018-01-06 ENCOUNTER — Encounter: Payer: Self-pay | Admitting: Family Medicine

## 2018-01-06 ENCOUNTER — Other Ambulatory Visit: Payer: Self-pay

## 2018-01-06 VITALS — BP 118/86 | HR 72 | Wt 193.0 lb

## 2018-01-06 DIAGNOSIS — N3000 Acute cystitis without hematuria: Secondary | ICD-10-CM | POA: Insufficient documentation

## 2018-01-06 DIAGNOSIS — R399 Unspecified symptoms and signs involving the genitourinary system: Secondary | ICD-10-CM

## 2018-01-06 LAB — POCT URINALYSIS DIP (MANUAL ENTRY)
BILIRUBIN UA: NEGATIVE
GLUCOSE UA: NEGATIVE mg/dL
Ketones, POC UA: NEGATIVE mg/dL
NITRITE UA: NEGATIVE
Protein Ur, POC: NEGATIVE mg/dL
Spec Grav, UA: 1.02 (ref 1.010–1.025)
Urobilinogen, UA: 0.2 E.U./dL
pH, UA: 8.5 — AB (ref 5.0–8.0)

## 2018-01-06 LAB — POCT UA - MICROSCOPIC ONLY

## 2018-01-06 MED ORDER — SULFAMETHOXAZOLE-TRIMETHOPRIM 800-160 MG PO TABS: 1.0000 | ORAL_TABLET | Freq: Two times a day (BID) | ORAL | 0 refills | Status: AC

## 2018-01-06 MED FILL — VENTOLIN HFA 90 MCG INHALER: 108 (90 BAS | 25 days supply | Qty: 18 | Fill #1

## 2018-01-06 MED FILL — SULFAMETHOXAZOLE-TMP DS TAB: 800-160 | 3 days supply | Qty: 6 | Fill #0

## 2018-01-06 NOTE — Patient Instructions (Signed)
Thank you for coming in to see us today. Please see below to review our plan for today's visit.  You have a urinary tract infection.  Take the Bactrim twice daily for 3 days.  Your symptoms should improve after completing this antibiotic.  If you continue to have discomfort, please return to the clinic for reevaluation.  Please call the clinic at [phone] if your symptoms worsen or you have any concerns. It was our pleasure to serve you.  Durward Parcelavid Karter Hellmer, DO [facility], PGY-2

## 2018-01-06 NOTE — Progress Notes (Signed)
   Subjective   Patient ID: Felix AhmadiBrandy V Gosa    DOB: 01-21-1976, 42 y.o. female   MRN: [id number]  CC: "UTI"  HPI: Felix AhmadiBrandy V Kurka is a 42 y.o. female who presents for a same day appointment for the following:  DYSURIA  Pain urinating started 1 days ago. Pain is: sharp Medications tried: Azo Any antibiotics in the last 30 days: no More than 3 UTIs in the last 12 months: no STD exposure: no Possibly pregnant: no  Symptoms Urgency: yes Frequency: yes Blood in urine: no Pain in back: no Fever: no Vaginal discharge: no Mouth Ulcers: no  ROS: see HPI for pertinent.  PMFSH: Chronic low back pain, hemorrhoids, insomnia.  Surgical history tubal.  Family history unremarkable.  Smoking status reviewed. Medications reviewed.  Objective   BP 118/86   Pulse 72   Wt 193 lb (87.5 kg)   LMP 12/25/2017 (Exact Date)   BMI 31.15 kg/m  Vitals and nursing note reviewed.  General: well nourished, well developed, NAD with non-toxic appearance HEENT: normocephalic, atraumatic, moist mucous membranes Cardiovascular: regular rate and rhythm without murmurs, rubs, or gallops Lungs: clear to auscultation bilaterally with normal work of breathing Abdomen: soft, non-tender, non-distended, normoactive bowel sounds, suprapubic tenderness present Skin: warm, dry, no rashes or lesions, cap refill < 2 seconds Extremities: warm and well perfused, normal tone, no edema  Assessment & Plan   Acute cystitis Acute.  UA with trace leuk however microscopy you showing 10 WBC per high-power field.  Consistent with acute cystitis.  No signs of pyelonephritis.  No red flags.  Previous urine cultures reviewed with pansensitive E. Coli. - Initiating Bactrim 800-160 twice daily for 3 days - Reviewed return precautions - RTC if symptoms do not improve following completion of antibiotic treatment  Orders Placed This Encounter  Procedures  . POCT urinalysis dipstick  . POCT UA - Microscopic Only   Meds  ordered this encounter  Medications  . sulfamethoxazole-trimethoprim (BACTRIM DS,SEPTRA DS) 800-160 MG tablet    Sig: Take 1 tablet by mouth 2 (two) times daily for 3 days.    Dispense:  6 tablet    Refill:  0    Durward Parcelavid Everly Rubalcava, DO [facility], PGY-2 01/06/2018, 11:41 AM

## 2018-01-06 NOTE — Assessment & Plan Note (Addendum)
Acute.  UA with trace leuk however microscopy you showing 10 WBC per high-power field.  Consistent with acute cystitis.  No signs of pyelonephritis.  No red flags.  Previous urine cultures reviewed with pansensitive E. Coli. - Initiating Bactrim 800-160 twice daily for 3 days - Reviewed return precautions - RTC if symptoms do not improve following completion of antibiotic treatment

## 2018-03-10 ENCOUNTER — Encounter: Payer: Self-pay | Admitting: Internal Medicine

## 2018-03-10 ENCOUNTER — Ambulatory Visit (INDEPENDENT_AMBULATORY_CARE_PROVIDER_SITE_OTHER): Payer: 59 | Admitting: Internal Medicine

## 2018-03-10 ENCOUNTER — Other Ambulatory Visit: Payer: Self-pay

## 2018-03-10 VITALS — BP 120/72 | HR 92 | Temp 99.2°F | Ht 66.0 in | Wt 196.4 lb

## 2018-03-10 DIAGNOSIS — J45909 Unspecified asthma, uncomplicated: Secondary | ICD-10-CM

## 2018-03-10 DIAGNOSIS — N924 Excessive bleeding in the premenopausal period: Secondary | ICD-10-CM

## 2018-03-10 DIAGNOSIS — K5900 Constipation, unspecified: Secondary | ICD-10-CM

## 2018-03-10 DIAGNOSIS — E669 Obesity, unspecified: Secondary | ICD-10-CM | POA: Diagnosis not present

## 2018-03-10 DIAGNOSIS — Z0000 Encounter for general adult medical examination without abnormal findings: Secondary | ICD-10-CM

## 2018-03-10 MED ORDER — ALBUTEROL SULFATE 108 (90 BASE) MCG/ACT IN AEPB: 2.0000 | INHALATION_SPRAY | Freq: Four times a day (QID) | RESPIRATORY_TRACT | 4 refills | Status: DC | PRN

## 2018-03-10 MED ORDER — FLUTICASONE PROPIONATE 50 MCG/ACT NA SUSP: 2.0000 | Freq: Every day | NASAL | 1 refills | Status: DC

## 2018-03-10 MED ORDER — MONTELUKAST SODIUM 10 MG PO TABS: 10.0000 mg | ORAL_TABLET | Freq: Every day | ORAL | 3 refills | Status: DC

## 2018-03-10 MED ORDER — TRAZODONE HCL 50 MG PO TABS: 25.0000 mg | ORAL_TABLET | Freq: Every evening | ORAL | 3 refills | Status: DC | PRN

## 2018-03-10 MED FILL — PROAIR RESPICLICK INHAL PWD: 108 (90 BAS | 25 days supply | Qty: 1 | Fill #0

## 2018-03-10 MED FILL — traZODone HCL 50 MG TABS: 50 | 20 days supply | Qty: 20 | Fill #0

## 2018-03-10 MED FILL — FLUTICASONE PROP 50 MCG SPR: 50 | 30 days supply | Qty: 16 | Fill #0

## 2018-03-10 MED FILL — MONTELUKAST SOD 10 MG TAB: 10 | 30 days supply | Qty: 30 | Fill #0

## 2018-03-10 NOTE — Patient Instructions (Signed)
Brooke [name],  Please start singulair nightly for asthma. Continue to use albuterol as needed. If no improvement in about 1 month, please see [place] back to consider starting a maintenance inhaler.  I will call with lab results.  Best, Dr. Sampson Goon

## 2018-03-11 LAB — LIPID PANEL
Chol/HDL Ratio: 2.3 ratio (ref 0.0–4.4)
Cholesterol, Total: 197 mg/dL (ref 100–199)
HDL: 85 mg/dL (ref >39)
LDL CALC: 68 mg/dL (ref 0–99)
Triglycerides: 221 mg/dL — ABNORMAL HIGH (ref 0–149)
VLDL CHOLESTEROL CAL: 44 mg/dL — AB (ref 5–40)

## 2018-03-11 LAB — CBC
HEMATOCRIT: 37.5 % (ref 34.0–46.6)
HEMOGLOBIN: 11.9 g/dL (ref 11.1–15.9)
MCH: 27.3 pg (ref 26.6–33.0)
MCHC: 31.7 g/dL (ref 31.5–35.7)
MCV: 86 fL (ref 79–97)
Platelets: 239 10*3/uL (ref 150–379)
RBC: 4.36 x10E6/uL (ref 3.77–5.28)
RDW: 14.6 % (ref 12.3–15.4)
WBC: 7.9 10*3/uL (ref 3.4–10.8)

## 2018-03-11 LAB — COMPREHENSIVE METABOLIC PANEL
ALBUMIN: 4.3 g/dL (ref 3.5–5.5)
ALK PHOS: 69 IU/L (ref 39–117)
ALT: 12 IU/L (ref 0–32)
AST: 16 IU/L (ref 0–40)
Albumin/Globulin Ratio: 1.5 (ref 1.2–2.2)
BUN/Creatinine Ratio: 20 (ref 9–23)
BUN: 18 mg/dL (ref 6–24)
CHLORIDE: 105 mmol/L (ref 96–106)
CO2: 26 mmol/L (ref 20–29)
CREATININE: 0.92 mg/dL (ref 0.57–1.00)
Calcium: 9.3 mg/dL (ref 8.7–10.2)
GFR calc non Af Amer: 78 mL/min/{1.73_m2} (ref >59)
GFR, EST AFRICAN AMERICAN: 90 mL/min/{1.73_m2} (ref >59)
Globulin, Total: 2.9 g/dL (ref 1.5–4.5)
Glucose: 115 mg/dL — ABNORMAL HIGH (ref 65–99)
Potassium: 5.3 mmol/L — ABNORMAL HIGH (ref 3.5–5.2)
Sodium: 142 mmol/L (ref 134–144)
Total Protein: 7.2 g/dL (ref 6.0–8.5)

## 2018-03-13 ENCOUNTER — Encounter: Payer: Self-pay | Admitting: Internal Medicine

## 2018-03-13 DIAGNOSIS — E669 Obesity, unspecified: Secondary | ICD-10-CM | POA: Insufficient documentation

## 2018-03-13 DIAGNOSIS — N924 Excessive bleeding in the premenopausal period: Secondary | ICD-10-CM | POA: Insufficient documentation

## 2018-03-13 DIAGNOSIS — K5900 Constipation, unspecified: Secondary | ICD-10-CM | POA: Insufficient documentation

## 2018-03-13 DIAGNOSIS — J4530 Mild persistent asthma, uncomplicated: Secondary | ICD-10-CM | POA: Insufficient documentation

## 2018-03-13 DIAGNOSIS — J45909 Unspecified asthma, uncomplicated: Secondary | ICD-10-CM | POA: Insufficient documentation

## 2018-03-13 NOTE — Assessment & Plan Note (Addendum)
-   Commended patient on regular exercise and shared recommendation of 150 minutes of moderate exercise daily. - Encouraged cutting back on processed foods.  - Ordered CMP and lipid panel.

## 2018-03-13 NOTE — Progress Notes (Signed)
[facility] Progress Note  Subjective:  Brooke [name] is a 42 y.o. female with history of asthma, hemorrhoids, and recent UTI. She presents for check-up.  Concerns:  - Breathing has been worse over the last few months. She finds herself getting short of breath walking from parking lot to where she needs to go. Has been needing to use her albuterol inhaler a couple times a day most days and before working out, whereas normally hadn't been needing to use it. Does not wake up with coughing or shortness of breath at night. Never smoker. Has never been on a maintenance inhaler. Thinks allergies do make her symptoms worse.  - Would like to lose weight. Identifies junk food as an area that she could improve upon.  - Would like refill on trazodone. Takes 25-50 mg prn for difficulty sleeping.   Social: Works out with cardio/weights about 4 times a week  HM: Next pap smear due 2020. Has not had recent blood work. No family history of breast or colon cancer to suggest early screening.   ROS: 11-pt review of systems negative aside from increased difficulty with breathing, occasionally heavy periods, and constipation   Allergies  Allergen Reactions  . Penicillins     Social History   Tobacco Use  . Smoking status: Never Smoker  . Smokeless tobacco: Never Used  Substance Use Topics  . Alcohol use: Yes    Comment: social   Objective: Blood pressure 120/72, pulse 92, temperature 99.2 F (37.3 C), temperature source Oral, height  (1.676 m), weight 196 lb 6.4 oz (89.1 kg), SpO2 99 %. Body mass index is 31.7 kg/m. Constitutional: Pleasant, obese female in NAD HENT: Mild swelling and erythema of nasal turbinates.  Cardiovascular: RRR, S1, S2, no m/r/g.  Pulmonary/Chest: Effort normal and breath sounds normal.  Abdominal: Soft. +BS, NT Musculoskeletal: No LE edema.  Neurological: AOx3, no focal deficits. Skin: Skin is warm and dry. No rash noted.  Psychiatric: Normal  mood and affect.  Vitals reviewed  PHQ2: Negative  Assessment/Plan: Persistent asthma without complication - Requiring increased use of albuterol inhaler, putting her in mild/moderate persistent asthma category. Patient prefers not to try maintenance inhaler yet. Would like to start with adding singulair first.  - Ordered singulair 10 mg QHS. Also recommended daily use of flonase.  - Anticipate needing to start maintenance ICS inhaler at next visit. Stressed importance of follow-up.   Excessive bleeding in premenopausal period - Only on about 1 day of menstrual cycle. Will check CBC to ensure patient not anemic.   Obesity - Commended patient on regular exercise and shared recommendation of 150 minutes of moderate exercise daily. - Encouraged cutting back on processed foods.  - Ordered CMP and lipid panel.  Constipation - Recommended restarting miralax and drinking plenty of water.  Follow-up in about 1 month to reassess asthma control.   Dani Gobble, MD [facility], PGY-3

## 2018-03-13 NOTE — Assessment & Plan Note (Signed)
-   Only on about 1 day of menstrual cycle. Will check CBC to ensure patient not anemic.

## 2018-03-13 NOTE — Assessment & Plan Note (Signed)
-   Requiring increased use of albuterol inhaler, putting her in mild/moderate persistent asthma category. Patient prefers not to try maintenance inhaler yet. Would like to start with adding singulair first.  - Ordered singulair 10 mg QHS. Also recommended daily use of flonase.  - Anticipate needing to start maintenance ICS inhaler at next visit. Stressed importance of follow-up.

## 2018-03-13 NOTE — Assessment & Plan Note (Signed)
-   Recommended restarting miralax and drinking plenty of water.

## 2018-07-06 MED FILL — traZODone HCL 50 MG TABS: 50 | 20 days supply | Qty: 20 | Fill #1

## 2018-07-16 ENCOUNTER — Other Ambulatory Visit: Payer: Self-pay

## 2018-07-16 ENCOUNTER — Encounter (HOSPITAL_COMMUNITY): Payer: Self-pay | Admitting: Emergency Medicine

## 2018-07-16 ENCOUNTER — Ambulatory Visit (HOSPITAL_COMMUNITY)
Admission: EM | Admit: 2018-07-16 | Discharge: 2018-07-16 | Disposition: A | Discharge status: Home / Self Care | Payer: 59 | Attending: Internal Medicine | Admitting: Internal Medicine

## 2018-07-16 DIAGNOSIS — L249 Irritant contact dermatitis, unspecified cause: Secondary | ICD-10-CM

## 2018-07-16 MED ORDER — PREDNISONE 50 MG PO TABS: 50.0000 mg | ORAL_TABLET | Freq: Every day | ORAL | 0 refills | Status: AC

## 2018-07-16 MED ORDER — TRIAMCINOLONE ACETONIDE 0.1 % EX CREA: 1.0000 "application " | TOPICAL_CREAM | Freq: Two times a day (BID) | CUTANEOUS | 0 refills | Status: DC

## 2018-07-16 NOTE — Discharge Instructions (Signed)
Please begin prednisone daily for the next 5 days with food Please continue to also use antihistamines for itching and reaction, may use Benadryl every 4-6 hours or a daily allergy pill twice daily like Zyrtec or Claritin to avoid the drowsiness with Benadryl  May apply triamcinolone cream no more than twice daily and thin amount to areas with breakthrough itching  Continue to monitor your rash, please follow-up if symptoms not improving, changing worsening

## 2018-07-16 NOTE — ED Triage Notes (Signed)
Blistery rash on neck, straddle area, right knee, left hand, right forearm.    Last Saturday did yard work and started itching on sunday

## 2018-07-16 NOTE — ED Provider Notes (Signed)
[facility]    CSN: [id number] Arrival date & time: 07/16/18  1126     History   Chief Complaint Chief Complaint  Patient presents with  . Rash    HPI Brooke [name] is a 42 y.o. female history of asthma presenting today for evaluation of a rash.  Patient states that last Saturday she was doing yard work pulling weeds and cutting hedges, the next day she developed itching and lesions on her upper extremities as well as between her thighs.  She has been taking Benadryl and is using many over-the-counter creams without relief.  Denies involvement of mouth or vaginal area.  Denies any new foods, lotions, soaps, detergents, medications.  HPI  Past Medical History:  Diagnosis Date  . Asthma     Patient Active Problem List   Diagnosis Date Noted  . Persistent asthma without complication 03/13/2018  . Obesity 03/13/2018  . Excessive bleeding in premenopausal period 03/13/2018  . Constipation 03/13/2018  . Acute cystitis 01/06/2018  . Bacterial vaginosis, recurrent  03/29/2017  . Vaginal discharge 10/26/2016  . Hemorrhoids 12/18/2015  . Insomnia 05/22/2014    Past Surgical History:  Procedure Laterality Date  . BTL    . TUBAL LIGATION Bilateral     OB History   None      Home Medications    Prior to Admission medications   Medication Sig Start Date End Date Taking? Authorizing Provider  Albuterol Sulfate (PROAIR RESPICLICK) 108 (90 Base) MCG/ACT AEPB Inhale 2 puffs into the lungs every 6 (six) hours as needed. 03/10/18   Casey Burkitt, MD  fluticasone [facility]) 50 MCG/ACT nasal spray Place 2 sprays into both nostrils daily. 03/10/18   Casey Burkitt, MD  montelukast (SINGULAIR) 10 MG tablet Take 1 tablet (10 mg total) by mouth at bedtime. 03/10/18   Casey Burkitt, MD  polyethylene glycol powder [facility]) powder Take 17 g by mouth 2 (two) times daily as needed (titrate to 1-2 soft bowel movements daily, no straining).  12/18/15   Abram Sander, MD  predniSONE (DELTASONE) 50 MG tablet Take 1 tablet (50 mg total) by mouth daily for 5 days. 07/16/18 07/21/18  Roosvelt Churchwell C, PA-C  traZODone (DESYREL) 50 MG tablet Take 0.5-1 tablets (25-50 mg total) by mouth at bedtime as needed for sleep. 03/10/18   Casey Burkitt, MD  triamcinolone cream (KENALOG) 0.1 % Apply 1 application topically 2 (two) times daily. 07/16/18   Josean Lycan, Junius Creamer, PA-C    Family History Family History  Problem Relation Age of Onset  . Hypertension Maternal Aunt   . Diabetes Maternal Grandmother     Social History Social History   Tobacco Use  . Smoking status: Never Smoker  . Smokeless tobacco: Never Used  Substance Use Topics  . Alcohol use: Yes    Comment: social  . Drug use: No     Allergies   Penicillins   Review of Systems Review of Systems  Constitutional: Negative for fatigue and fever.  HENT: Negative for mouth sores.   Eyes: Negative for visual disturbance.  Respiratory: Negative for shortness of breath.   Cardiovascular: Negative for chest pain.  Gastrointestinal: Negative for abdominal pain, nausea and vomiting.  Genitourinary: Negative for genital sores.  Musculoskeletal: Negative for arthralgias and joint swelling.  Skin: Positive for color change and rash. Negative for wound.  Neurological: Negative for dizziness, weakness, light-headedness and headaches.     Physical Exam Triage Vital Signs ED Triage Vitals  Enc Vitals Group     BP 07/16/18 1311 121/80     Pulse Rate 07/16/18 1311 82     Resp 07/16/18 1311 16     Temp 07/16/18 1311 98.6 F (37 C)     Temp Source 07/16/18 1311 Oral     SpO2 07/16/18 1311 100 %     Weight --      Height --      Head Circumference --      Peak Flow --      Pain Score 07/16/18 1308 0     Pain Loc --      Pain Edu? --      Excl. in GC? --    No data found.  Updated Vital Signs BP 121/80 (BP Location: Right Arm)   Pulse 82   Temp 98.6 F (37 C)  (Oral)   Resp 16   LMP 07/16/2018   SpO2 100%   Visual Acuity Right Eye Distance:   Left Eye Distance:   Bilateral Distance:    Right Eye Near:   Left Eye Near:    Bilateral Near:     Physical Exam  Constitutional: She is oriented to person, place, and time. She appears well-developed and well-nourished.  No acute distress  HENT:  Head: Normocephalic and atraumatic.  Nose: Nose normal.  Mouth/Throat: Oropharynx is clear and moist.  No lesions on oral mucosa, posterior pharynx patent, no uvula swelling  Eyes: Conjunctivae are normal.  Neck: Neck supple.  Cardiovascular: Normal rate.  Pulmonary/Chest: Effort normal. No respiratory distress.  Abdominal: She exhibits no distension.  Musculoskeletal: Normal range of motion.  Neurological: She is alert and oriented to person, place, and time.  Skin: Skin is warm and dry.  Erythematous vesicular-like rash to distal upper extremities, between thighs  Psychiatric: She has a normal mood and affect.  Nursing note and vitals reviewed.    UC Treatments / Results  Labs (all labs ordered are listed, but only abnormal results are displayed) Labs Reviewed - No data to display  EKG None  Radiology No results found.  Procedures Procedures (including critical care time)  Medications Ordered in UC Medications - No data to display  Initial Impression / Assessment and Plan / UC Course  I have reviewed the triage vital signs and the nursing notes.  Pertinent labs & imaging results that were available during my care of the patient were reviewed by me and considered in my medical decision making (see chart for details).     Rash appears poison ivy like, possible contact dermatitis.  Will provide prednisone daily for 5 days, continue antihistamines.  Provided triamcinolone cream to use sparingly for breakthrough itching.Discussed strict return precautions. Patient verbalized understanding and is agreeable with plan.  Final Clinical  Impressions(s) / UC Diagnoses   Final diagnoses:  Irritant contact dermatitis, unspecified trigger     Discharge Instructions     Please begin prednisone daily for the next 5 days with food Please continue to also use antihistamines for itching and reaction, may use Benadryl every 4-6 hours or a daily allergy pill twice daily like Zyrtec or Claritin to avoid the drowsiness with Benadryl  May apply triamcinolone cream no more than twice daily and thin amount to areas with breakthrough itching  Continue to monitor your rash, please follow-up if symptoms not improving, changing worsening   ED Prescriptions    Medication Sig Dispense Auth. Provider   predniSONE (DELTASONE) 50 MG tablet Take 1 tablet (50 mg  total) by mouth daily for 5 days. 5 tablet Katelin Kutsch C, PA-C   triamcinolone cream (KENALOG) 0.1 % Apply 1 application topically 2 (two) times daily. 30 g Idrissa Beville, [place] C, PA-C     Controlled Substance Prescriptions [place] Controlled Substance Registry consulted? Not Applicable   Lew Dawes, [place] 07/16/18 1353

## 2018-10-11 MED FILL — MONTELUKAST SOD 10 MG TAB: 10 | 30 days supply | Qty: 30 | Fill #1

## 2018-10-11 MED FILL — traZODone HCL 50 MG TABS: 50 | 20 days supply | Qty: 20 | Fill #2

## 2018-12-20 MED FILL — PROAIR RESPICLICK INHAL PWD: 108 (90 BAS | 25 days supply | Qty: 1 | Fill #1 | Status: TO

## 2018-12-21 DIAGNOSIS — M7651 Patellar tendinitis, right knee: Secondary | ICD-10-CM | POA: Diagnosis not present

## 2018-12-21 DIAGNOSIS — M25561 Pain in right knee: Secondary | ICD-10-CM | POA: Diagnosis not present

## 2019-01-09 DIAGNOSIS — M25561 Pain in right knee: Secondary | ICD-10-CM | POA: Diagnosis not present

## 2019-01-13 DIAGNOSIS — M25561 Pain in right knee: Secondary | ICD-10-CM | POA: Diagnosis not present

## 2019-01-18 DIAGNOSIS — M25561 Pain in right knee: Secondary | ICD-10-CM | POA: Diagnosis not present

## 2019-01-20 DIAGNOSIS — M25561 Pain in right knee: Secondary | ICD-10-CM | POA: Diagnosis not present

## 2019-02-25 MED FILL — traZODone HCL 50 MG TABS: 50 | 20 days supply | Qty: 20 | Fill #0

## 2019-02-25 MED FILL — PROAIR RESPICLICK INHAL PWD: 108 (90 BAS | 25 days supply | Qty: 1 | Fill #0

## 2019-03-11 ENCOUNTER — Other Ambulatory Visit: Payer: Self-pay | Admitting: Internal Medicine

## 2019-03-13 MED FILL — PROAIR RESPICLICK INHAL PWD: 108 (90 BAS | 30 days supply | Qty: 1 | Fill #0

## 2019-04-29 ENCOUNTER — Encounter (HOSPITAL_COMMUNITY): Payer: Self-pay | Admitting: Emergency Medicine

## 2019-04-29 ENCOUNTER — Emergency Department (HOSPITAL_COMMUNITY)
Admission: EM | Admit: 2019-04-29 | Discharge: 2019-04-30 | Disposition: A | Discharge status: Home / Self Care | Payer: 59 | Attending: Emergency Medicine | Admitting: Emergency Medicine

## 2019-04-29 ENCOUNTER — Other Ambulatory Visit: Payer: Self-pay

## 2019-04-29 DIAGNOSIS — Y999 Unspecified external cause status: Secondary | ICD-10-CM | POA: Diagnosis not present

## 2019-04-29 DIAGNOSIS — S161XXA Strain of muscle, fascia and tendon at neck level, initial encounter: Secondary | ICD-10-CM | POA: Insufficient documentation

## 2019-04-29 DIAGNOSIS — Y92410 Unspecified street and highway as the place of occurrence of the external cause: Secondary | ICD-10-CM | POA: Diagnosis not present

## 2019-04-29 DIAGNOSIS — J45909 Unspecified asthma, uncomplicated: Secondary | ICD-10-CM | POA: Diagnosis not present

## 2019-04-29 DIAGNOSIS — M545 Low back pain, unspecified: Secondary | ICD-10-CM

## 2019-04-29 DIAGNOSIS — Y939 Activity, unspecified: Secondary | ICD-10-CM | POA: Insufficient documentation

## 2019-04-29 DIAGNOSIS — Z79899 Other long term (current) drug therapy: Secondary | ICD-10-CM | POA: Insufficient documentation

## 2019-04-29 DIAGNOSIS — S199XXA Unspecified injury of neck, initial encounter: Secondary | ICD-10-CM | POA: Diagnosis present

## 2019-04-29 MED ORDER — ACETAMINOPHEN 325 MG PO TABS
650.0000 mg | ORAL_TABLET | Freq: Once | ORAL | Status: AC
  Administered 2019-04-29: 650 mg via ORAL
  Filled 2019-04-29: qty 2
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
## 2019-04-30 DIAGNOSIS — M545 Low back pain: Secondary | ICD-10-CM | POA: Diagnosis not present

## 2019-04-30 DIAGNOSIS — Z79899 Other long term (current) drug therapy: Secondary | ICD-10-CM | POA: Diagnosis not present

## 2019-04-30 DIAGNOSIS — J45909 Unspecified asthma, uncomplicated: Secondary | ICD-10-CM | POA: Diagnosis not present

## 2019-04-30 DIAGNOSIS — S161XXA Strain of muscle, fascia and tendon at neck level, initial encounter: Secondary | ICD-10-CM | POA: Diagnosis not present

## 2019-04-30 MED ORDER — METHOCARBAMOL 500 MG PO TABS
750.0000 mg | ORAL_TABLET | Freq: Once | ORAL | Status: AC
  Administered 2019-04-30: 750 mg via ORAL
  Filled 2019-04-30: qty 2

## 2019-04-30 MED ORDER — METHOCARBAMOL 750 MG PO TABS: 750.0000 mg | ORAL_TABLET | Freq: Three times a day (TID) | ORAL | 0 refills | Status: DC | PRN

## 2019-04-30 NOTE — Discharge Instructions (Signed)
Please take Ibuprofen (Advil, motrin) and Tylenol (acetaminophen) to relieve your pain.  You may take up to 600 MG (3 pills) of normal strength ibuprofen every 8 hours as needed.  In between doses of ibuprofen you make take tylenol, up to 1,000 mg (two extra strength pills).  Do not take more than 3,000 mg tylenol in a 24 hour period.  Please check all medication labels as many medications such as pain and cold medications may contain tylenol.  Do not drink alcohol while taking these medications.  Do not take other NSAID'S while taking ibuprofen (such as aleve or naproxen).  Please take ibuprofen with food to decrease stomach upset. ° °Today you received medications that may make you sleepy or impair your ability to make decisions.  For the next 24 hours please do not drive, operate heavy machinery, care for a small child with out another adult present, or perform any activities that may cause harm to you or someone else if you were to fall asleep or be impaired.  ° °You are being prescribed a medication which may make you sleepy. Please follow up of listed precautions for at least 24 hours after taking one dose. ° °The best way to get rid of muscle pain is by taking NSAIDS, using heat, massage therapy, and gentle stretching/range of motion exercises. ° °
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
## 2019-05-01 ENCOUNTER — Encounter: Payer: Self-pay | Admitting: Family Medicine

## 2019-05-01 ENCOUNTER — Other Ambulatory Visit: Payer: Self-pay

## 2019-05-01 ENCOUNTER — Ambulatory Visit (INDEPENDENT_AMBULATORY_CARE_PROVIDER_SITE_OTHER): Payer: 59 | Admitting: Family Medicine

## 2019-05-01 VITALS — BP 106/78 | HR 86

## 2019-05-01 DIAGNOSIS — S161XXS Strain of muscle, fascia and tendon at neck level, sequela: Secondary | ICD-10-CM

## 2019-05-01 DIAGNOSIS — S161XXA Strain of muscle, fascia and tendon at neck level, initial encounter: Secondary | ICD-10-CM | POA: Insufficient documentation
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
## 2019-05-01 NOTE — Patient Instructions (Addendum)
Ibuprofen 600mg  every 6 hours as needed. You can also do tylenol in between.   Icy hot/bengay  Keep using heat  Muscle relaxer this week is fine if you want      Cervical Strain and Sprain Rehab Ask your health care provider which exercises are safe for you. Do exercises exactly as told by your health care provider and adjust them as directed. It is normal to feel mild stretching, pulling, tightness, or discomfort as you do these exercises, but you should stop right away if you feel sudden pain or your pain gets worse.Do not begin these exercises until told by your health care provider. Stretching and range of motion exercises These exercises warm up your muscles and joints and improve the movement and flexibility of your neck. These exercises also help to relieve pain, numbness, and tingling. Exercise A: Cervical side bend  1. Using good posture, sit on a stable chair or stand up. 2. Without moving your shoulders, slowly tilt your left / right ear to your shoulder until you feel a stretch in your neck muscles. You should be looking straight ahead. 3. Hold for __________ seconds. 4. Repeat with the other side of your neck. Repeat __________ times. Complete this exercise __________ times a day. Exercise B: Cervical rotation  1. Using good posture, sit on a stable chair or stand up. 2. Slowly turn your head to the side as if you are looking over your left / right shoulder. ? Keep your eyes level with the ground. ? Stop when you feel a stretch along the side and the back of your neck. 3. Hold for __________ seconds. 4. Repeat this by turning to your other side. Repeat __________ times. Complete this exercise __________ times a day. Exercise C: Thoracic extension and pectoral stretch 1. Roll a towel or a small blanket so it is about 4 inches (10 cm) in diameter. 2. Lie down on your back on a firm surface. 3. Put the towel lengthwise, under your spine in the middle of your back. It  should not be not under your shoulder blades. The towel should line up with your spine from your middle back to your lower back. 4. Put your hands behind your head and let your elbows fall out to your sides. 5. Hold for __________ seconds. Repeat __________ times. Complete this exercise __________ times a day. Strengthening exercises These exercises build strength and endurance in your neck. Endurance is the ability to use your muscles for a long time, even after your muscles get tired. Exercise D: Upper cervical flexion, isometric 1. Lie on your back with a thin pillow behind your head and a small rolled-up towel under your neck. 2. Gently tuck your chin toward your chest and nod your head down to look toward your feet. Do not lift your head off the pillow. 3. Hold for __________ seconds. 4. Release the tension slowly. Relax your neck muscles completely before you repeat this exercise. Repeat __________ times. Complete this exercise __________ times a day. Exercise E: Cervical extension, isometric  1. Stand about 6 inches (15 cm) away from a wall, with your back facing the wall. 2. Place a soft object, about 6-8 inches (15-20 cm) in diameter, between the back of your head and the wall. A soft object could be a small pillow, a ball, or a folded towel. 3. Gently tilt your head back and press into the soft object. Keep your jaw and forehead relaxed. 4. Hold for __________ seconds. 5. Release  the tension slowly. Relax your neck muscles completely before you repeat this exercise. Repeat __________ times. Complete this exercise __________ times a day. Posture and body mechanics Body mechanics refers to the movements and positions of your body while you do your daily activities. Posture is part of body mechanics. Good posture and healthy body mechanics can help to relieve stress in your body's tissues and joints. Good posture means that your spine is in its natural S-curve position (your spine is  neutral), your shoulders are pulled back slightly, and your head is not tipped forward. The following are general guidelines for applying improved posture and body mechanics to your everyday activities. Standing   When standing, keep your spine neutral and keep your feet about hip-width apart. Keep a slight bend in your knees. Your ears, shoulders, and hips should line up.  When you do a task in which you stand in one place for a long time, place one foot up on a stable object that is 2-4 inches (5-10 cm) high, such as a footstool. This helps keep your spine neutral. Sitting   When sitting, keep your spine neutral and your keep feet flat on the floor. Use a footrest, if necessary, and keep your thighs parallel to the floor. Avoid rounding your shoulders, and avoid tilting your head forward.  When working at a desk or a computer, keep your desk at a height where your hands are slightly lower than your elbows. Slide your chair under your desk so you are close enough to maintain good posture.  When working at a computer, place your monitor at a height where you are looking straight ahead and you do not have to tilt your head forward or downward to look at the screen. Resting When lying down and resting, avoid positions that are most painful for you. Try to support your neck in a neutral position. You can use a contour pillow or a small rolled-up towel. Your pillow should support your neck but not push on it. This information is not intended to replace advice given to you by your health care provider. Make sure you discuss any questions you have with your health care provider. Document Released: 10/26/2005 Document Revised: 07/02/2016 Document Reviewed: 10/02/2015 Elsevier Interactive Patient Education  2019 [employer].
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
## 2019-09-05 ENCOUNTER — Other Ambulatory Visit: Payer: Self-pay | Admitting: Internal Medicine

## 2019-09-05 MED FILL — PROAIR RESPICLICK INHAL PWD: 108 (90 BAS | 25 days supply | Qty: 1 | Fill #0

## 2019-09-07 MED FILL — traZODone HCL 50 MG TABS: 50 | 30 days supply | Qty: 30 | Fill #0

## 2020-01-26 MED FILL — traZODone HCL 50 MG TABS: 50 | 30 days supply | Qty: 30 | Fill #1

## 2020-01-26 MED FILL — PROAIR RESPICLICK INHAL PWD: 108 (90 BAS | 25 days supply | Qty: 1 | Fill #1

## 2020-03-25 NOTE — Progress Notes (Signed)
Subjective:   Patient ID: Brooke [name]    DOB: 1976/10/04, 44 y.o. female   MRN: [id number]  Brooke [name] is a 44 y.o. female with a history of hemorrhoids, persistent asthma, insomnia, obesity here for check up and pap-smear.  Current concerns:  Right ear pain that is intermittent, left sometimes has similar symptoms. Denies tinnitus or hearing loss or dizziness. Denies any fevers, chills. Has history of allergies. She has taken OTC zyrtec PRN, last taken 1 week ago. She notes scratchy throat and watery eyes. Denies any rhinorrhea or congestion.   Mild Persistent Asthma:  Currently on Albuterol PRN. Used to have worse symptoms but they have improved with age. Denies night time cough, SOB, wheezing. Last used albuterol inhaler this morning, but usually uses it once or twice a week.   Insomnia; Currently on Trazodone 25-50mg  qHS. Endorses nightly use with improvement in symptoms. Denies an excessive tiredness.   Health Maintenance: Due for Pap-smear and COVID -19 Vaccine No family history of breast or colon cancer to suggest early screening.  ROS: 11 point review of systems negative aside from right ear pain.   Review of Systems:  Per HPI.   Objective:   BP 114/86   Pulse 92   Ht 5\' 6"  (1.676 m)   Wt 210 lb 12.8 oz (95.6 kg)   LMP 03/15/2020 (Exact Date)   SpO2 100%   BMI 34.02 kg/m  Vitals and nursing note reviewed.  General: Pleasant middle-aged female, sitting comfortably on exam bed, well nourished, well developed, in no acute distress with non-toxic appearance HEENT: normocephalic, atraumatic, moist mucous membranes, oropharynx without exudates, mild erythema; right ear with erythema surrounding TM, mildly opaque but light reflex present, tender to manipulation; left tympanic membrane without erythema, effusion, or bulge CV: regular rate and rhythm without murmurs, rubs, or gallops, 2+ radial pulses bilaterally Lungs: clear to auscultation bilaterally with normal  work of breathing Abdomen: soft, non-tender, normoactive bowel sounds Skin: warm, dry Extremities: warm and well perfused MSK: gait normal Neuro: Alert and oriented, speech normal Pelvic exam: VULVA: normal appearing vulva with no masses, tenderness or lesions, VAGINA: normal appearing vagina with normal color and discharge, no lesions, CERVIX: normal appearing cervix without discharge or lesions, friable with easy bleeding, UTERUS: uterus is normal size, shape, consistency and nontender, ADNEXA: normal adnexa in size, nontender and no masses Exam chaperoned by Eusebio Friendly.  Assessment & Plan:   Annual Physical Exam: Patient here today for annual physical exam. PMH, surgical history, and social history were reviewed. The following concerns below were discussed. Screening labs obtained and WNL.   Mild persistent asthma Having to use controller medication for SOB 1-2x per week. Will start PRN to daily Symbicort.   Insomnia Well controlled on Trazodone 25-50mg  qHS PRN. Patient should continue sleep hygiene.  Health Maintenance: Pap-smear with HPV performed today. Patient did not receive Covid vaccine.  Discussed her concerns and answered her questions.  Patient is considering receiving vaccine but would like more time to think about it.  Screen for Hyperlipidemia: Chol 201, HDL 92, Trig 91, and LDL 93. Recommend low fat and healthy fat diet. Repeat screen in 4-6 years unless otherwise indicated. The 10-year ASCVD risk score Mikey Bussing DC Jr., et al., 2013) is: 0.1%   Screen for STDs: RPR, Hep C and HIV negative. GC/Cl/Tric pending. Will follow up and treat if indicated.  Orders Placed This Encounter  Procedures  . Comprehensive metabolic panel  . Lipid Panel  . HIV  Antibody (routine testing w rflx)  . RPR  . Hepatitis C antibody   Meds ordered this encounter  Medications  . budesonide-formoterol (SYMBICORT) 80-4.5 MCG/ACT inhaler    Sig: Inhale 2 puffs into the lungs 2 (two)  times daily.    Dispense:  1 Inhaler    Refill:  3  . fluticasone (FLONASE) 50 MCG/ACT nasal spray    Sig: Place 2 sprays into both nostrils daily.    Dispense:  16 g    Refill:  1    Orpah Cobb, DO PGY-2, [facility] 03/27/2020 9:19 AM

## 2020-03-26 ENCOUNTER — Ambulatory Visit (INDEPENDENT_AMBULATORY_CARE_PROVIDER_SITE_OTHER): Payer: 59 | Admitting: Family Medicine

## 2020-03-26 ENCOUNTER — Other Ambulatory Visit: Payer: Self-pay

## 2020-03-26 ENCOUNTER — Encounter: Payer: Self-pay | Admitting: Family Medicine

## 2020-03-26 ENCOUNTER — Other Ambulatory Visit (HOSPITAL_COMMUNITY)
Admission: RE | Admit: 2020-03-26 | Discharge: 2020-03-26 | Disposition: A | Discharge status: Home / Self Care | Payer: 59 | Source: Ambulatory Visit | Attending: Family Medicine | Admitting: Family Medicine

## 2020-03-26 VITALS — BP 114/86 | HR 92 | Ht 66.0 in | Wt 210.8 lb

## 2020-03-26 DIAGNOSIS — Z124 Encounter for screening for malignant neoplasm of cervix: Secondary | ICD-10-CM

## 2020-03-26 DIAGNOSIS — Z13220 Encounter for screening for lipoid disorders: Secondary | ICD-10-CM

## 2020-03-26 DIAGNOSIS — F5105 Insomnia due to other mental disorder: Secondary | ICD-10-CM

## 2020-03-26 DIAGNOSIS — T7840XA Allergy, unspecified, initial encounter: Secondary | ICD-10-CM

## 2020-03-26 DIAGNOSIS — Z113 Encounter for screening for infections with a predominantly sexual mode of transmission: Secondary | ICD-10-CM | POA: Diagnosis not present

## 2020-03-26 DIAGNOSIS — Z0000 Encounter for general adult medical examination without abnormal findings: Secondary | ICD-10-CM | POA: Diagnosis not present

## 2020-03-26 DIAGNOSIS — J4530 Mild persistent asthma, uncomplicated: Secondary | ICD-10-CM

## 2020-03-26 DIAGNOSIS — F409 Phobic anxiety disorder, unspecified: Secondary | ICD-10-CM | POA: Diagnosis not present

## 2020-03-26 MED ORDER — BUDESONIDE-FORMOTEROL FUMARATE 80-4.5 MCG/ACT IN AERO: 2.0000 | INHALATION_SPRAY | Freq: Two times a day (BID) | RESPIRATORY_TRACT | 3 refills | Status: DC

## 2020-03-26 MED ORDER — FLUTICASONE PROPIONATE 50 MCG/ACT NA SUSP: 2.0000 | Freq: Every day | NASAL | 1 refills | Status: DC

## 2020-03-26 MED FILL — SYMBICORT 80-4.5 MCG INH: 80-4.5 | 30 days supply | Qty: 10 | Fill #0

## 2020-03-26 MED FILL — FLUTICASONE PROP 50 MCG SPR: 50 | 30 days supply | Qty: 16 | Fill #0

## 2020-03-26 NOTE — Patient Instructions (Signed)
Thank you for coming to see me today. It was a pleasure to see you.   Today we obtained your Pap smear and some labs.  I will call you if anything is abnormal otherwise I will send you a letter with your results.  Please try taking Claritin, Zyrtec, or Allegra daily and using Flonase once a day to help with your symptoms.  If you continue to have persistent ear pain please call there is further discuss.  Let us plan to see each other on a yearly basis, sooner if you have any concerns.  If you have any questions or concerns, please do not hesitate to call the office at [phone].  Take Care,   Dr. Orpah Cobb, DO Resident Physician [facility] [phone]

## 2020-03-27 DIAGNOSIS — T7840XA Allergy, unspecified, initial encounter: Secondary | ICD-10-CM | POA: Insufficient documentation

## 2020-03-27 LAB — HEPATITIS C ANTIBODY: Hep C Virus Ab: <0.1 s/co ratio (ref 0.0–0.9)

## 2020-03-27 LAB — COMPREHENSIVE METABOLIC PANEL
ALT: 8 IU/L (ref 0–32)
AST: 13 IU/L (ref 0–40)
Albumin/Globulin Ratio: 1.4 (ref 1.2–2.2)
Albumin: 4.3 g/dL (ref 3.8–4.8)
Alkaline Phosphatase: 73 IU/L (ref 48–121)
BUN/Creatinine Ratio: 16 (ref 9–23)
BUN: 14 mg/dL (ref 6–24)
Bilirubin Total: <0.2 mg/dL (ref 0.0–1.2)
CO2: 24 mmol/L (ref 20–29)
Calcium: 9.1 mg/dL (ref 8.7–10.2)
Chloride: 104 mmol/L (ref 96–106)
Creatinine, Ser: 0.86 mg/dL (ref 0.57–1.00)
GFR calc Af Amer: 96 mL/min/{1.73_m2} (ref >59)
GFR calc non Af Amer: 84 mL/min/{1.73_m2} (ref >59)
Globulin, Total: 3 g/dL (ref 1.5–4.5)
Glucose: 79 mg/dL (ref 65–99)
Potassium: 4.5 mmol/L (ref 3.5–5.2)
Sodium: 139 mmol/L (ref 134–144)
Total Protein: 7.3 g/dL (ref 6.0–8.5)

## 2020-03-27 LAB — HIV ANTIBODY (ROUTINE TESTING W REFLEX): HIV Screen 4th Generation wRfx: NONREACTIVE

## 2020-03-27 LAB — LIPID PANEL
Chol/HDL Ratio: 2.2 ratio (ref 0.0–4.4)
Cholesterol, Total: 201 mg/dL — ABNORMAL HIGH (ref 100–199)
HDL: 92 mg/dL (ref >39)
LDL Chol Calc (NIH): 93 mg/dL (ref 0–99)
Triglycerides: 91 mg/dL (ref 0–149)
VLDL Cholesterol Cal: 16 mg/dL (ref 5–40)

## 2020-03-27 LAB — RPR: RPR Ser Ql: NONREACTIVE

## 2020-03-27 NOTE — Assessment & Plan Note (Signed)
Patient complaining of remittent right ear, watery eyes and scratchy throat.  Does not currently take anything for allergies.  Physical exam not consistent with bacterial infection at this time.  Recommended daily use of OTC nonsedating antihistamine and Flonase.  Recommended patient call if ear pain worsens or does not improve with above treatment.  Patient voiced understanding agreement with plan.

## 2020-03-27 NOTE — Assessment & Plan Note (Signed)
Having to use controller medication for SOB 1-2x per week. Will start PRN to daily Symbicort.

## 2020-03-27 NOTE — Assessment & Plan Note (Signed)
Well controlled on Trazodone 25-50mg  qHS PRN. Patient should continue sleep hygiene.

## 2020-03-28 ENCOUNTER — Encounter: Payer: Self-pay | Admitting: Family Medicine

## 2020-03-28 ENCOUNTER — Other Ambulatory Visit: Payer: Self-pay | Admitting: Family Medicine

## 2020-03-28 DIAGNOSIS — A599 Trichomoniasis, unspecified: Secondary | ICD-10-CM

## 2020-03-28 LAB — CERVICOVAGINAL ANCILLARY ONLY
Chlamydia: NEGATIVE
Comment: NEGATIVE
Comment: NEGATIVE
Comment: NORMAL
Neisseria Gonorrhea: NEGATIVE
Trichomonas: POSITIVE — AB

## 2020-03-28 LAB — CYTOLOGY - PAP
Comment: NEGATIVE
Diagnosis: NEGATIVE
High risk HPV: NEGATIVE

## 2020-03-28 MED ORDER — METRONIDAZOLE 500 MG PO TABS: 500.0000 mg | ORAL_TABLET | Freq: Two times a day (BID) | ORAL | 0 refills | Status: AC

## 2020-03-28 MED FILL — metroNIDAZOLE 500 MG TABS: 500 | 7 days supply | Qty: 14 | Fill #0

## 2020-07-24 ENCOUNTER — Other Ambulatory Visit: Payer: Self-pay

## 2020-07-24 ENCOUNTER — Other Ambulatory Visit: Payer: 59

## 2020-07-24 DIAGNOSIS — Z20822 Contact with and (suspected) exposure to covid-19: Secondary | ICD-10-CM | POA: Diagnosis not present

## 2020-07-26 LAB — NOVEL CORONAVIRUS, NAA

## 2020-08-30 ENCOUNTER — Telehealth: Payer: 59 | Admitting: Nurse Practitioner

## 2020-08-30 ENCOUNTER — Encounter: Payer: Self-pay | Admitting: Family Medicine

## 2020-08-30 DIAGNOSIS — N3000 Acute cystitis without hematuria: Secondary | ICD-10-CM | POA: Diagnosis not present

## 2020-08-30 MED ORDER — NITROFURANTOIN MONOHYD MACRO 100 MG PO CAPS: 100.0000 mg | ORAL_CAPSULE | Freq: Two times a day (BID) | ORAL | 0 refills | Status: DC

## 2020-08-30 NOTE — Progress Notes (Signed)

## 2021-02-17 ENCOUNTER — Other Ambulatory Visit (HOSPITAL_COMMUNITY): Payer: Self-pay

## 2021-02-21 ENCOUNTER — Other Ambulatory Visit: Payer: Self-pay | Admitting: Internal Medicine

## 2021-02-21 ENCOUNTER — Other Ambulatory Visit (HOSPITAL_COMMUNITY): Payer: Self-pay

## 2021-02-24 ENCOUNTER — Other Ambulatory Visit (HOSPITAL_COMMUNITY): Payer: Self-pay

## 2021-02-24 MED ORDER — TRAZODONE HCL 50 MG PO TABS
25.0000 mg | ORAL_TABLET | Freq: Every evening | ORAL | 2 refills | Status: DC | PRN
  Filled 2021-02-24 – 2021-03-07 (×3): qty 30, 30d supply, fill #0
  Filled 2021-12-31: qty 30, 30d supply, fill #1

## 2021-02-24 MED ORDER — PROAIR RESPICLICK 108 (90 BASE) MCG/ACT IN AEPB
2.0000 | INHALATION_SPRAY | Freq: Four times a day (QID) | RESPIRATORY_TRACT | 2 refills | Status: DC | PRN
  Filled 2021-02-24 – 2021-03-07 (×2): qty 1, 25d supply, fill #0

## 2021-03-04 ENCOUNTER — Other Ambulatory Visit (HOSPITAL_COMMUNITY): Payer: Self-pay

## 2021-03-07 ENCOUNTER — Other Ambulatory Visit (HOSPITAL_COMMUNITY): Payer: Self-pay

## 2021-04-08 NOTE — Progress Notes (Signed)
SUBJECTIVE:   Chief compliant/HPI: annual examination  Brooke [name] is a 45 y.o. who presents today for an annual exam.   PMH: mild persistent asthma, allergies, insomnia, obesity  Acute Concerns: Allergies with rhinorrhea, itchy watery eyes. Seems to make her asthma flare as well. Takes Benadryl at night. Has tried zyrtec, Claritin, allegra without much improvement. Has used Flonase in past but ran out.  Social History: LMP: 03/31/21 Contraception: tubal Alcohol: occasionally Tobacco: None Illicit Drugs: None Safe at home: yes Depression/Suicidality: occasional down Exercise: yes, 30 mins a day  Health Maintenance: Health Maintenance Due  Topic  . COVID-19 Vaccine (1)   No family history of breast or colon cancer to suggest early screening.  Review of systems: see HPI   OBJECTIVE:   BP 116/72   Pulse 81   Wt 198 lb 9.6 oz (90.1 kg)   SpO2 99%   BMI 32.05 kg/m   General: pleasant young female, sitting comfortably in exam chair, well nourished, well developed, in no acute distress with non-toxic appearance HEENT: normocephalic, atraumatic, moist mucous membranes, oropharynx clear without erythema or exudate, TM normal bilaterally, swollen nasal turbinate on left Neck: supple, non-tender without lymphadenopathy,  no thyromegaly CV: regular rate and rhythm without murmurs, rubs, or gallops, no lower extremity edema, 2+ radial and pedal pulses bilaterally Lungs: clear to auscultation bilaterally with normal work of breathing on room air, speaking in full sentences Abdomen: soft, non-tender, non-distended, no masses or organomegaly palpable, normoactive bowel sounds Skin: warm, dry, no rashes or lesions Extremities: warm and well perfused, normal tone MSK: , gait normal Neuro: Alert and oriented, speech normal  ASSESSMENT/PLAN:   Annual Physical Exam: Patient here today for annual physical exam.  PMH, surgical history, and social history were reviewed. The  following concerns below were discussed.   Mild persistent asthma Chronic. Mild. Uses Symbicort but expensive. Symptoms well controlled with PRN albuterol Look at alternatives however does not appear any others will be covered by insurance Patient noted she will continue Symbicort and albuterol as needed  Allergies Chronic.  Seasonal.  - Flonase in each nostril daily. Works best if used daily -Start daily Zyrtec, Art gallery manager, or Human resources officer - Start Singulair 42m daily - Use Atrovent nasal spray as needed for runny nose - Use Prednisolone 1% drops: 1 drop into ear as needed for itchiness -Follow-up no improvement, can consider referral to allergist   Insomnia Chronic.  Well-controlled with trazodone as needed.  Obesity Improved.  Has lost 12 pounds in the last year.  Congratulated patient on her success.   Snoring  Day time Fatigue: STOP BANG score of 2 making her low risk. She does snore and is fatigued throughout the day. Opted to wait on sleep study at this time. Thought this was appropriate.  Annual Examination  See AVS for age appropriate recommendations.   Blood pressure reviewed and at goal The patient currently uses tubal ligation and abstinence for contraception  Considered the following items based upon USPSTF recommendations: Diabetes screening: ordered Screening for elevated cholesterol: Deferred.. Last lipid panel 2021. The 10-year ASCVD risk score (Mikey BussingDC JBrooke Bonito, et al., 2013) is: 0.2%  HIV testing: Discussed. Declined. Not at high risk. Negative 03/26/20 Hepatitis C: Discussed. Declined. Not at high risk. Negative 03/26/20 Hepatitis B: Plan to get when getting blood work next time. Not at high risk. Syphilis if at high risk: Discussed. Declined. Not at high risk. Negative 03/26/20 GC/CT not at high risk and not ordered. Negative 03/26/20 Reviewed  risk factors for latent tuberculosis and not indicated  Discussed family history, BRCA testing not indicated.  Cervical cancer  screening: prior Pap reviewed, repeat due in 03/2025 Breast cancer screening: discussed potential benefits, risks including overdiagnosis and biopsy, elected to wait until age 64 Colorectal cancer screening: not applicable given age.  if age 57 or over.   Follow up in 1 year or sooner if indicated.    [place]

## 2021-04-09 ENCOUNTER — Encounter: Payer: 59 | Admitting: Family Medicine

## 2021-04-09 ENCOUNTER — Ambulatory Visit (INDEPENDENT_AMBULATORY_CARE_PROVIDER_SITE_OTHER): Payer: 59 | Admitting: Family Medicine

## 2021-04-09 ENCOUNTER — Encounter: Payer: Self-pay | Admitting: Family Medicine

## 2021-04-09 ENCOUNTER — Telehealth: Payer: Self-pay

## 2021-04-09 ENCOUNTER — Other Ambulatory Visit (HOSPITAL_COMMUNITY): Payer: Self-pay

## 2021-04-09 ENCOUNTER — Other Ambulatory Visit: Payer: Self-pay

## 2021-04-09 VITALS — BP 116/72 | HR 81 | Wt 198.6 lb

## 2021-04-09 DIAGNOSIS — Z0000 Encounter for general adult medical examination without abnormal findings: Secondary | ICD-10-CM | POA: Diagnosis not present

## 2021-04-09 DIAGNOSIS — E669 Obesity, unspecified: Secondary | ICD-10-CM

## 2021-04-09 DIAGNOSIS — J4530 Mild persistent asthma, uncomplicated: Secondary | ICD-10-CM | POA: Diagnosis not present

## 2021-04-09 DIAGNOSIS — Z131 Encounter for screening for diabetes mellitus: Secondary | ICD-10-CM

## 2021-04-09 DIAGNOSIS — F5105 Insomnia due to other mental disorder: Secondary | ICD-10-CM

## 2021-04-09 DIAGNOSIS — F409 Phobic anxiety disorder, unspecified: Secondary | ICD-10-CM | POA: Diagnosis not present

## 2021-04-09 DIAGNOSIS — T7840XD Allergy, unspecified, subsequent encounter: Secondary | ICD-10-CM

## 2021-04-09 DIAGNOSIS — Z6832 Body mass index (BMI) 32.0-32.9, adult: Secondary | ICD-10-CM

## 2021-04-09 LAB — POCT GLYCOSYLATED HEMOGLOBIN (HGB A1C): Hemoglobin A1C: 5.3 % (ref 4.0–5.6)

## 2021-04-09 MED ORDER — ALBUTEROL SULFATE HFA 108 (90 BASE) MCG/ACT IN AERS
2.0000 | INHALATION_SPRAY | RESPIRATORY_TRACT | 2 refills | Status: DC | PRN
  Filled 2021-04-09: qty 36, 17d supply, fill #0

## 2021-04-09 MED ORDER — FLUTICASONE PROPIONATE 50 MCG/ACT NA SUSP
2.0000 | Freq: Every day | NASAL | 1 refills | Status: DC
  Filled 2021-04-09: qty 16, 30d supply, fill #0

## 2021-04-09 MED ORDER — MONTELUKAST SODIUM 10 MG PO TABS
10.0000 mg | ORAL_TABLET | Freq: Every day | ORAL | 1 refills | Status: AC
  Filled 2021-04-09: qty 90, 90d supply, fill #0

## 2021-04-09 MED ORDER — IPRATROPIUM BROMIDE 0.03 % NA SOLN
2.0000 | Freq: Two times a day (BID) | NASAL | 12 refills | Status: DC
  Filled 2021-04-09: qty 30, 43d supply, fill #0

## 2021-04-09 MED ORDER — PROAIR RESPICLICK 108 (90 BASE) MCG/ACT IN AEPB
2.0000 | INHALATION_SPRAY | Freq: Four times a day (QID) | RESPIRATORY_TRACT | 2 refills | Status: DC | PRN
  Filled 2021-04-09: qty 1, 25d supply, fill #0

## 2021-04-09 MED ORDER — PREDNISOLONE ACETATE 1 % OP SUSP
OPHTHALMIC | 0 refills | Status: DC
  Filled 2021-04-09: qty 5, 30d supply, fill #0

## 2021-04-09 NOTE — Assessment & Plan Note (Signed)
Chronic.  Well-controlled with trazodone as needed.

## 2021-04-09 NOTE — Telephone Encounter (Signed)
Pharmacy calls nurse line regarding rx for prednisolone drops. Per pharmacist, they cannot dispense without prn frequency. Please send updated rx to [facility] or call at [phone] to update rx.   Veronda Prude, RN

## 2021-04-09 NOTE — Assessment & Plan Note (Signed)
Improved.  Has lost 12 pounds in the last year.  Congratulated patient on her success.

## 2021-04-09 NOTE — Patient Instructions (Signed)
It was a pleasure to see you today!  Thank you for choosing [facility] for your primary care.   Our plans for today were:  Allergies: Use Flonase in each nostril daily. Works best if used Control and instrumentation engineer, Scientist, forensic, or [employer] 10mg  daily  Use Atrovent nasal spray as needed for runny nose  Use Prednisolone 1% drops: 1 drop into ear as needed for itchiness  Call if no improvement, we can discuss referral to allergist    We are checking some labs today, I will call you if they are abnormal will send you a MyChart message or a letter if they are normal.  If you do not hear about your labs in the next 2 weeks please let know.  BRING ALL OF YOUR MEDICATIONS WITH YOU TO EVERY VISIT   You should return to our clinic in 1 year for annual physical.   Best Wishes,   [place], DO

## 2021-04-09 NOTE — Assessment & Plan Note (Addendum)
Chronic.  Seasonal.  - Flonase in each nostril daily. Works best if used daily -Start daily Zyrtec, Scientist, forensic, or Careers adviser - Start Singulair 10mg  daily - Use Atrovent nasal spray as needed for runny nose - Use Prednisolone 1% drops: 1 drop into ear as needed for itchiness -Follow-up no improvement, can consider referral to allergist

## 2021-04-09 NOTE — Assessment & Plan Note (Signed)
Chronic. Mild. Uses Symbicort but expensive. Symptoms well controlled with PRN albuterol Look at alternatives however does not appear any others will be covered by insurance Patient noted she will continue Symbicort and albuterol as needed

## 2021-04-10 ENCOUNTER — Other Ambulatory Visit (HOSPITAL_COMMUNITY): Payer: Self-pay

## 2021-04-10 MED ORDER — PREDNISOLONE ACETATE 1 % OP SUSP
OPHTHALMIC | 0 refills | Status: DC
  Filled 2021-04-10: qty 5, 17d supply, fill #0

## 2021-04-10 NOTE — Telephone Encounter (Signed)
Rx has been correct and sent to pharmacy.

## 2021-04-10 NOTE — Addendum Note (Signed)
Addended by: Joana Reamer on: 04/10/2021 07:33 AM   Modules accepted: Orders

## 2021-04-11 ENCOUNTER — Other Ambulatory Visit (HOSPITAL_COMMUNITY): Payer: Self-pay

## 2021-04-22 ENCOUNTER — Telehealth: Payer: 59 | Admitting: Emergency Medicine

## 2021-04-22 DIAGNOSIS — J019 Acute sinusitis, unspecified: Secondary | ICD-10-CM | POA: Diagnosis not present

## 2021-04-22 MED ORDER — DOXYCYCLINE HYCLATE 100 MG PO CAPS: 100.0000 mg | ORAL_CAPSULE | Freq: Two times a day (BID) | ORAL | 0 refills | Status: DC

## 2021-04-22 MED ORDER — IPRATROPIUM BROMIDE 0.03 % NA SOLN: 2.0000 | Freq: Two times a day (BID) | NASAL | 0 refills | Status: DC

## 2021-04-22 NOTE — Progress Notes (Signed)
Virtual Visit Consent   Brooke [name], you are scheduled for a virtual visit with a [facility] provider today.     Just as with appointments in the office, your consent must be obtained to participate.  Your consent will be active for this visit and any virtual visit you may have with one of our providers in the next 365 days.     If you have a MyChart account, a copy of this consent can be sent to you electronically.  All virtual visits are billed to your insurance company just like a traditional visit in the office.    As this is a virtual visit, video technology does not allow for your provider to perform a traditional examination.  This may limit your provider's ability to fully assess your condition.  If your provider identifies any concerns that need to be evaluated in person or the need to arrange testing (such as labs, EKG, etc.), we will make arrangements to do so.     Although advances in technology are sophisticated, we cannot ensure that it will always work on either your end or our end.  If the connection with a video visit is poor, the visit may have to be switched to a telephone visit.  With either a video or telephone visit, we are not always able to ensure that we have a secure connection.     I need to obtain your verbal consent now.   Are you willing to proceed with your visit today?    Brooke [name] has provided verbal consent on 04/22/2021 for a virtual visit video.   [name], PA-C   Date: 04/22/2021 2:44 PM   Virtual Visit via Video Note   IRoxy Horseman, connected [email or web address] ([id number], 1976/10/20) on 04/22/21 at  2:45 PM EDT by a video-enabled telemedicine application and verified that I am speaking with the correct person using two identifiers.  Location: Patient: Virtual Visit Location Patient: Home Provider: Virtual Visit Location Provider: Home Office   I discussed the limitations of evaluation and management by telemedicine and the  availability of in person appointments. The patient expressed understanding and agreed to proceed.    History of Present Illness: Brooke [name] is a 45 y.o. who identifies as a female who was assigned female at birth, and is being seen today for has been having sinus congestion and cough.  Has had sore throat.  Denies any fever.  Has taken 2 home COVID tests.  Has tried Mucinex and Nyquil.  Has been having symptoms for 11 days, started to feel better then sinus congestion worsened.  HPI: HPI  Problems:  Patient Active Problem List   Diagnosis Date Noted   Allergies 03/27/2020   Mild persistent asthma 03/13/2018   Obesity 03/13/2018   Insomnia 05/22/2014    Allergies:  Allergies  Allergen Reactions   Penicillins    Medications:  Current Outpatient Medications:    albuterol (VENTOLIN HFA) 108 (90 Base) MCG/ACT inhaler, Inhale 2-4 puffs into the lungs every 4 (four) hours as needed for wheezing (or cough)., Disp: 36 g, Rfl: 2   budesonide-formoterol (SYMBICORT) 80-4.5 MCG/ACT inhaler, Inhale 2 puffs into the lungs 2 (two) times daily., Disp: 1 Inhaler, Rfl: 3   fluticasone (FLONASE) 50 MCG/ACT nasal spray, Place 2 sprays into both nostrils daily., Disp: 16 g, Rfl: 1   ipratropium (ATROVENT) 0.03 % nasal spray, Place 2 sprays into both nostrils every 12 (twelve) hours., Disp: 30 mL, Rfl: 12  montelukast (SINGULAIR) 10 MG tablet, Take 1 tablet (10 mg total) by mouth at bedtime., Disp: 90 tablet, Rfl: 1   prednisoLONE acetate (PRED FORTE) 1 % ophthalmic suspension, Place 1 drop in ears up to three times a day as needed for itching. (Please note: to be applied to the ears), Disp: 15 mL, Rfl: 0   traZODone (DESYREL) 50 MG tablet, Take 1/2-1 tablets (25-50 mg total) by mouth at bedtime as needed for sleep., Disp: 30 tablet, Rfl: 2  Observations/Objective: Patient is well-developed, well-nourished in no acute distress.  Resting comfortably at home.  Head is normocephalic, atraumatic.  No  labored breathing.  Speech is clear and coherent with logical content.  Patient is alert and oriented at baseline.    Assessment and Plan: 1. Acute sinusitis, recurrence not specified, unspecified location  - Doxy for sinusitis -Atrovent nasal spray for congestion -PCP follow-up if not improving.  Follow Up Instructions: I discussed the assessment and treatment plan with the patient. The patient was provided an opportunity to ask questions and all were answered. The patient agreed with the plan and demonstrated an understanding of the instructions.  A copy of instructions were sent to the patient via MyChart.  The patient was advised to call back or seek an in-person evaluation if the symptoms worsen or if the condition fails to improve as anticipated.  Time:  I spent 13 minutes with the patient via telehealth technology discussing the above problems/concerns.    [name], PA-C

## 2021-04-29 NOTE — Progress Notes (Signed)
    SUBJECTIVE:   CHIEF COMPLAINT / HPI:   Sore throat, runny nose, cough, loss of taste/smell: Symptoms started June 3rd. She was having congestion, runny nose, body aches.  She also lost her taste and smell sensation.  Took 2 home Covid19 tests which were negative.  She states that her symptoms resolved in about a week but she still has cough, congestion, sinus pressure.  She did a video doctor call who gave her an antibiotic thinking that this may be a sinus infection after a viral illness. She still has congestion and sore throat and still has no good sense of taste or smell. Still having cough. She still has 2 days of doxycycline to go for questionable sinus infection.  She states she is vaccinated and boosted against COVID-19.  PERTINENT  PMH / PSH: History of asthma  OBJECTIVE:   Pulse 80   Temp 99.2 F (37.3 C) (Oral)   SpO2 98%    General: NAD, pleasant, able to participate in exam HEENT: No pharyngeal erythema, no nuchal rigidity Cardiac: RRR, no murmurs. Respiratory: Clear to auscultation bilaterally, no wheezing Abdomen: Bowel sounds present, nontender Skin: warm and dry, no rashes noted  ASSESSMENT/PLAN:   Viral URI with cough Assessment: 45 y.o. female with loss of taste/smell, runny nose, sore throat, cough since June 3 which has been going on for about 20 days.  She has taken multiple rapid antigen test at home which have been negative.  She still has loss of taste and smell which is her main concern today.  She did do a virtual appointment with a physician with her ongoing congestion and facial pressure and was prescribed an antibiotic for possible sinus infection, she has 2 days remaining of her doxycycline for this.  She denies any fevers.  Denies any shortness of breath.  Most likely etiology is COVID-19 with her loss of taste and smell, she is well-appearing and physical exam is reassuring at this time.  Fortunately she was vaccinated and boosted against COVID-19 and  so hopefully that helped reduce the severity of the illness if that was what she had. I discussed a couple of options that we can do today including COVID-19 testing and we ultimately decided to proceed with this because if it is positive it would help explain her loss of taste and smell.  I endorse that even if it is negative I still believe it is a high likelihood that she had COVID-19 earlier in the month with her loss of taste and smell and that hopefully it will improve with time.  I did recommend that she follow back up with [place] if she still has sinus pressure about 1 week after completing her antibiotics.  Discussed return precautions.   Jackelyn Poling, DO [facility]

## 2021-04-30 ENCOUNTER — Ambulatory Visit (INDEPENDENT_AMBULATORY_CARE_PROVIDER_SITE_OTHER): Payer: 59 | Admitting: Family Medicine

## 2021-04-30 ENCOUNTER — Other Ambulatory Visit: Payer: Self-pay

## 2021-04-30 VITALS — HR 80 | Temp 99.2°F

## 2021-04-30 DIAGNOSIS — J069 Acute upper respiratory infection, unspecified: Secondary | ICD-10-CM | POA: Insufficient documentation

## 2021-04-30 NOTE — Patient Instructions (Addendum)
It was great to see you! Thank you for allowing me to participate in your care!  Our plans for today:  -We are checking COVID-19 testing.  We should get the result back in 1 to 2 days and I will let you know the result when it returns. -If you develop any chest pains, trouble breathing, shortness of breath, vomiting to the extent that she cannot keep down fluids, or any other concerning symptoms I would like you to immediately let us know or go to the emergency department. -If you are still having symptoms about 1 week after you complete your antibiotics for the sinus infection please let us know.   Take care and seek immediate care sooner if you develop any concerns.   Dr. Jackelyn Poling, DO [facility]

## 2021-04-30 NOTE — Assessment & Plan Note (Signed)
Assessment: 45 y.o. female with loss of taste/smell, runny nose, sore throat, cough since June 3 which has been going on for about 20 days.  She has taken multiple rapid antigen test at home which have been negative.  She still has loss of taste and smell which is her main concern today.  She did do a virtual appointment with a physician with her ongoing congestion and facial pressure and was prescribed an antibiotic for possible sinus infection, she has 2 days remaining of her doxycycline for this.  She denies any fevers.  Denies any shortness of breath.  Most likely etiology is COVID-19 with her loss of taste and smell, she is well-appearing and physical exam is reassuring at this time.  Fortunately she was vaccinated and boosted against COVID-19 and so hopefully that helped reduce the severity of the illness if that was what she had. I discussed a couple of options that we can do today including COVID-19 testing and we ultimately decided to proceed with this because if it is positive it would help explain her loss of taste and smell.  I endorse that even if it is negative I still believe it is a high likelihood that she had COVID-19 earlier in the month with her loss of taste and smell and that hopefully it will improve with time.  I did recommend that she follow back up with [place] if she still has sinus pressure about 1 week after completing her antibiotics.  Discussed return precautions.

## 2021-05-01 LAB — NOVEL CORONAVIRUS, NAA: SARS-CoV-2, NAA: NOT DETECTED

## 2021-05-01 LAB — SARS-COV-2, NAA 2 DAY TAT

## 2021-07-08 ENCOUNTER — Other Ambulatory Visit (HOSPITAL_COMMUNITY): Payer: Self-pay

## 2021-07-08 ENCOUNTER — Other Ambulatory Visit: Payer: Self-pay

## 2021-07-08 ENCOUNTER — Ambulatory Visit (INDEPENDENT_AMBULATORY_CARE_PROVIDER_SITE_OTHER): Payer: 59 | Admitting: Otolaryngology

## 2021-07-08 DIAGNOSIS — H6981 Other specified disorders of Eustachian tube, right ear: Secondary | ICD-10-CM

## 2021-07-08 DIAGNOSIS — R430 Anosmia: Secondary | ICD-10-CM

## 2021-07-08 DIAGNOSIS — J310 Chronic rhinitis: Secondary | ICD-10-CM

## 2021-07-08 MED ORDER — TRIAMCINOLONE ACETONIDE 55 MCG/ACT NA AERO
2.0000 | INHALATION_SPRAY | Freq: Every day | NASAL | 12 refills | Status: DC
  Filled 2021-07-08: qty 1, 1d supply, fill #0
  Filled 2022-04-14: qty 16.5, fill #0

## 2021-07-08 NOTE — Progress Notes (Signed)
HPI: Brooke [name] is a 45 y.o. female who presents is referred by Dr. Pecola Leisure for evaluation of decreased sense of smell and taste that began on June 3 when she had flu type symptoms.  She had 3 negative COVID test.  She occasionally gets a with a mild odor but still has difficulty smelling and tasting.  She does not have trouble breathing through her nose. She also complains of occasional itching and fullness in the right ear.  She has used some eardrops with itching in the ear.  She does not notice that much difficulty with her hearing but she complains of fullness in the ear at times..  Past Medical History:  Diagnosis Date   Asthma    Past Surgical History:  Procedure Laterality Date   BTL     TUBAL LIGATION Bilateral    Social History   Socioeconomic History   Marital status: Single    Spouse name: Not on file   Number of children: Not on file   Years of education: Not on file   Highest education level: Not on file  Occupational History   Not on file  Tobacco Use   Smoking status: Never   Smokeless tobacco: Never  Substance and Sexual Activity   Alcohol use: Yes    Comment: social   Drug use: No   Sexual activity: Not on file  Other Topics Concern   Not on file  Social History Narrative   Not on file   Social Determinants of Health   Financial Resource Strain: Not on file  Food Insecurity: Not on file  Transportation Needs: Not on file  Physical Activity: Not on file  Stress: Not on file  Social Connections: Not on file   Family History  Problem Relation Age of Onset   Hypertension Maternal Aunt    Diabetes Maternal Grandmother    Allergies  Allergen Reactions   Penicillins    Prior to Admission medications   Medication Sig Start Date End Date Taking? Authorizing Provider  albuterol (VENTOLIN HFA) 108 (90 Base) MCG/ACT inhaler Inhale 2-4 puffs into the lungs every 4 (four) hours as needed for wheezing (or cough). 04/09/21   Mullis, Kiersten P, DO   budesonide-formoterol (SYMBICORT) 80-4.5 MCG/ACT inhaler Inhale 2 puffs into the lungs 2 (two) times daily. 03/26/20   Mullis, Kiersten P, DO  doxycycline (VIBRAMYCIN) 100 MG capsule Take 1 capsule (100 mg total) by mouth 2 (two) times daily. 04/22/21   [name], PA-C  fluticasone (FLONASE) 50 MCG/ACT nasal spray Place 2 sprays into both nostrils daily. 04/09/21   Mullis, Kiersten P, DO  ipratropium (ATROVENT) 0.03 % nasal spray Place 2 sprays into both nostrils every 12 (twelve) hours. 04/22/21   [name], PA-C  montelukast (SINGULAIR) 10 MG tablet Take 1 tablet (10 mg total) by mouth at bedtime. 04/09/21   Mullis, Kiersten P, DO  prednisoLONE acetate (PRED FORTE) 1 % ophthalmic suspension Place 1 drop in ears up to three times a day as needed for itching. (Please note: to be applied to the ears) 04/10/21   Mullis, Kiersten P, DO  traZODone (DESYREL) 50 MG tablet Take 1/2-1 tablets (25-50 mg total) by mouth at bedtime as needed for sleep. 02/24/21   Mullis, Kiersten P, DO     Positive ROS: Otherwise negative  All other systems have been reviewed and were otherwise negative with the exception of those mentioned in the HPI and as above.  Physical Exam: Constitutional: Alert, well-appearing, no acute distress  Ears: External ears without lesions or tenderness. Ear canals are clear bilaterally with intact, clear TMs.  The right TM was slightly retracted but she was able to insufflate air behind the right TM without difficulty.  On hearing screening with a 512 1024 tuning fork she heard about the same in both ears with AC > BC bilaterally. Nasal: External nose without lesions. Septum with minimal deformity and moderate rhinitis.  After decongesting the nose the superior nasal cavity was clear as was the middle meatus bilaterally.  No polyps noted.  No signs of infection.. Oral: Lips and gums without lesions. Tongue and palate mucosa without lesions. Posterior oropharynx clear. Neck: No palpable  adenopathy or masses Respiratory: Breathing comfortably  Skin: No facial/neck lesions or rash noted.  Procedures  Assessment: Loss of sense of smell and taste most likely secondary to a viral infection possibly COVID. Eustachian tube dysfunction with mild rhinitis with clear TMs otherwise.  Plan: Recommended use of Nasacort 2 sprays each nostril at night as this will help with eustachian tube dysfunction as well as help with nasal congestion and hopefully will help with increase sense of smell.  I would expect this to gradually improve over the next several months.   Narda Bonds, MD   CC:

## 2021-07-19 ENCOUNTER — Telehealth: Payer: 59 | Admitting: Nurse Practitioner

## 2021-07-19 DIAGNOSIS — N3000 Acute cystitis without hematuria: Secondary | ICD-10-CM | POA: Diagnosis not present

## 2021-07-19 MED ORDER — NITROFURANTOIN MONOHYD MACRO 100 MG PO CAPS: 100.0000 mg | ORAL_CAPSULE | Freq: Two times a day (BID) | ORAL | 0 refills | Status: DC

## 2021-07-19 NOTE — Progress Notes (Signed)

## 2021-12-31 ENCOUNTER — Other Ambulatory Visit (HOSPITAL_COMMUNITY): Payer: Self-pay

## 2022-02-13 ENCOUNTER — Telehealth: Payer: 59 | Admitting: Physician Assistant

## 2022-02-13 DIAGNOSIS — R3989 Other symptoms and signs involving the genitourinary system: Secondary | ICD-10-CM | POA: Diagnosis not present

## 2022-02-13 MED ORDER — NITROFURANTOIN MONOHYD MACRO 100 MG PO CAPS: 100.0000 mg | ORAL_CAPSULE | Freq: Two times a day (BID) | ORAL | 0 refills | Status: DC

## 2022-02-13 NOTE — Progress Notes (Signed)

## 2022-04-14 ENCOUNTER — Other Ambulatory Visit (HOSPITAL_COMMUNITY): Payer: Self-pay

## 2022-04-14 ENCOUNTER — Encounter: Payer: Self-pay | Admitting: *Deleted

## 2022-05-07 ENCOUNTER — Other Ambulatory Visit (HOSPITAL_COMMUNITY): Payer: Self-pay

## 2022-05-18 ENCOUNTER — Other Ambulatory Visit: Payer: Self-pay | Admitting: Family Medicine

## 2022-05-18 ENCOUNTER — Other Ambulatory Visit (HOSPITAL_COMMUNITY): Payer: Self-pay

## 2022-05-18 DIAGNOSIS — J4530 Mild persistent asthma, uncomplicated: Secondary | ICD-10-CM

## 2022-05-19 ENCOUNTER — Other Ambulatory Visit (HOSPITAL_COMMUNITY): Payer: Self-pay

## 2022-05-19 MED ORDER — ALBUTEROL SULFATE HFA 108 (90 BASE) MCG/ACT IN AERS
2.0000 | INHALATION_SPRAY | RESPIRATORY_TRACT | 2 refills | Status: DC | PRN
  Filled 2022-05-19: qty 25.5, 25d supply, fill #0
  Filled 2022-10-27: qty 36, 36d supply, fill #1

## 2022-05-19 MED ORDER — TRAZODONE HCL 50 MG PO TABS
25.0000 mg | ORAL_TABLET | Freq: Every evening | ORAL | 0 refills | Status: DC | PRN
  Filled 2022-05-19: qty 30, 30d supply, fill #0

## 2022-05-29 ENCOUNTER — Ambulatory Visit: Payer: 59 | Admitting: Family Medicine

## 2022-05-29 ENCOUNTER — Encounter: Payer: Self-pay | Admitting: Family Medicine

## 2022-05-29 VITALS — BP 127/82 | HR 83 | Temp 98.4°F | Ht 66.0 in | Wt 203.2 lb

## 2022-05-29 DIAGNOSIS — Z20822 Contact with and (suspected) exposure to covid-19: Secondary | ICD-10-CM

## 2022-05-29 DIAGNOSIS — J069 Acute upper respiratory infection, unspecified: Secondary | ICD-10-CM | POA: Diagnosis not present

## 2022-05-29 NOTE — Progress Notes (Addendum)
    SUBJECTIVE:   CHIEF COMPLAINT / HPI:   Brooke [name] is a 46 y.o. female who presents today for cough.  Cough Starting Sunday 05/24/2022, pt has been experiencing cough with sore throat, chest "congestion," rhinorrhea, body ache, headache, and SOB with exertion. She also reports some chest tightness/soreness which worsens with cough; SOB also worsens with cough. Cough is mostly at night, and sometimes keeps her up; it is occasionally productive with yellow phlegm. She reports her symptoms have been persistent since Sunday with intermittent severity (fluctuating).  No significant dysphagia (improved from initial presentation); no fever, chills. No dysuria, changes in urination. No changes in bowel habits. No new changes in taste/smell. No sick contacts. She has been using Nyquil robitussin for cough which somewhat improved her symptoms; helps her sleep.  Coughing is her most concerning symptom.  Of note, has been off a lot of her asthma meds; reports occasional SOB, but no worsening of asthma symptoms.  PERTINENT  PMH / PSH: Asthma  OBJECTIVE:   BP 127/82   Pulse 83   Temp 98.4 F (36.9 C)   Ht 5\' 6"  (1.676 m)   Wt 203 lb 3.2 oz (92.2 kg)   LMP 05/09/2022   SpO2 100%   BMI 32.80 kg/m   General: Pt is generally well appearing if a bit tired; no acute distress. Seated comfortably in chair. Cardiovascular: RRR, no murmurs, rubs, gallops. Pulmonary: Normal work of breathing. Lungs clear to auscultation bilaterally. No wheezing, rales or rhonchi.  HEENT: Nonbulging TMs bilaterally; no erythema or drainage. No tonsillar erythema or exudates. No cervical lymphadenopathy. Neuro/Psych: A&Ox3. Normal affect.  ASSESSMENT/PLAN:   1. Viral URI with cough Pt reports cough and other URI symptoms since 07/16. No abnormal ear findings, no fever, no severe symptoms make bacterial etiology less likely; suspect viral etiology. - Informed pt that symptoms should resolve on their own with  supportive care; encouraged rest and hydration - Pt can use honey or humidifier to help improve symptoms.  PHQ9 score: 2; negative question 9 (reviewed)  8/16, Medical Student [facility]   I was personally present and performed or re-performed the history, physical exam and medical decision making activities of this service and have verified that the service and findings are accurately documented in the student's note.  [facility], DO                  05/29/2022, 4:41 PM  PGY-3, [facility]

## 2022-05-29 NOTE — Patient Instructions (Signed)
It was great seeing you today!  Today we discussed your symptoms, I am sorry that you are not feeling well. I believe that you most likely have a viral infection so you do not need antibiotics as this will not get your symptoms to go away any sooner. Your COVID testing is pending, I will let you know of any abnormal result.   Please make sure to get plenty of rest and stay hydrated. Honey can help with the cough. I would avoid over the counter cough medications as these can raise your blood pressure. A humidifier can also help with congestion.   Please follow up at your next scheduled appointment, if anything arises between now and then, please don't hesitate to contact our office.   Thank you for allowing [place] to be a part of your medical care!  Thank you, Dr. Robyne Peers

## 2022-05-30 LAB — NOVEL CORONAVIRUS, NAA: SARS-CoV-2, NAA: DETECTED — AB

## 2022-10-27 ENCOUNTER — Other Ambulatory Visit (HOSPITAL_COMMUNITY): Payer: Self-pay

## 2022-10-27 ENCOUNTER — Other Ambulatory Visit: Payer: Self-pay | Admitting: Family Medicine

## 2022-10-27 ENCOUNTER — Other Ambulatory Visit: Payer: Self-pay

## 2022-10-27 DIAGNOSIS — J4530 Mild persistent asthma, uncomplicated: Secondary | ICD-10-CM

## 2022-10-27 MED ORDER — ALBUTEROL SULFATE HFA 108 (90 BASE) MCG/ACT IN AERS
2.0000 | INHALATION_SPRAY | RESPIRATORY_TRACT | 2 refills | Status: DC | PRN
  Filled 2022-10-27: qty 20.1, 25d supply, fill #0
  Filled 2022-11-30: qty 20.1, 25d supply, fill #1
  Filled 2023-01-11: qty 20.1, 25d supply, fill #2

## 2022-11-30 ENCOUNTER — Other Ambulatory Visit (HOSPITAL_COMMUNITY): Payer: Self-pay

## 2023-01-11 ENCOUNTER — Other Ambulatory Visit (HOSPITAL_COMMUNITY): Payer: Self-pay

## 2023-04-27 ENCOUNTER — Other Ambulatory Visit: Payer: Self-pay | Admitting: Family Medicine

## 2023-04-27 ENCOUNTER — Other Ambulatory Visit (HOSPITAL_COMMUNITY): Payer: Self-pay

## 2023-04-27 DIAGNOSIS — J4530 Mild persistent asthma, uncomplicated: Secondary | ICD-10-CM

## 2023-04-27 MED ORDER — ALBUTEROL SULFATE HFA 108 (90 BASE) MCG/ACT IN AERS
2.0000 | INHALATION_SPRAY | RESPIRATORY_TRACT | 2 refills | Status: DC | PRN
  Filled 2023-04-27: qty 20.1, 50d supply, fill #0

## 2023-06-02 ENCOUNTER — Encounter: Payer: Self-pay | Admitting: Family Medicine

## 2023-06-02 ENCOUNTER — Ambulatory Visit (INDEPENDENT_AMBULATORY_CARE_PROVIDER_SITE_OTHER): Payer: 59 | Admitting: Family Medicine

## 2023-06-02 ENCOUNTER — Other Ambulatory Visit (HOSPITAL_COMMUNITY): Payer: Self-pay

## 2023-06-02 DIAGNOSIS — Z1211 Encounter for screening for malignant neoplasm of colon: Secondary | ICD-10-CM | POA: Diagnosis not present

## 2023-06-02 DIAGNOSIS — T7840XD Allergy, unspecified, subsequent encounter: Secondary | ICD-10-CM | POA: Diagnosis not present

## 2023-06-02 DIAGNOSIS — F5105 Insomnia due to other mental disorder: Secondary | ICD-10-CM

## 2023-06-02 DIAGNOSIS — L299 Pruritus, unspecified: Secondary | ICD-10-CM | POA: Insufficient documentation

## 2023-06-02 DIAGNOSIS — F409 Phobic anxiety disorder, unspecified: Secondary | ICD-10-CM

## 2023-06-02 MED ORDER — HYDROCORTISONE-ACETIC ACID 1-2 % OT SOLN
2.0000 [drp] | Freq: Every evening | OTIC | 0 refills | Status: AC | PRN
  Filled 2023-06-02: qty 10, 30d supply, fill #0

## 2023-06-02 MED ORDER — TRAZODONE HCL 50 MG PO TABS
25.0000 mg | ORAL_TABLET | Freq: Every evening | ORAL | 0 refills | Status: DC | PRN
  Filled 2023-06-02: qty 30, 30d supply, fill #0

## 2023-06-02 MED ORDER — CETIRIZINE HCL 10 MG PO TABS
10.0000 mg | ORAL_TABLET | Freq: Every day | ORAL | 3 refills | Status: DC
  Filled 2023-06-02: qty 100, 100d supply, fill #0
  Filled 2023-06-02: qty 90, 90d supply, fill #0
  Filled 2023-09-05: qty 100, 100d supply, fill #1
  Filled 2023-11-30: qty 100, 100d supply, fill #2
  Filled 2024-03-21: qty 100, 100d supply, fill #3

## 2023-06-02 NOTE — Assessment & Plan Note (Signed)
Given patient's low risk and negative familial and personal history for colon cancer, patient and I decided to order Cologuard for her.  She will complete based on instructions included in the testing kit.

## 2023-06-02 NOTE — Assessment & Plan Note (Signed)
This has been an ongoing problem for the patient.  Given low suspicion for infectious etiology I have prescribed VoSol eardrops to be used as needed in the evening when the itching is most bothersome.

## 2023-06-02 NOTE — Progress Notes (Signed)
    SUBJECTIVE:   CHIEF COMPLAINT / HPI:  Brooke [name] is a 47 year old female here today for general checkup. Shared decision making for colon cancer screening discussed. Last lipid panel was in 2021, same with bmp. ASCVD risk based on previous results is 0.4% repeat labs not necessary at this time.  R ear pain at night. Comes and goes, nagging itchy feeling. Only in the right ear. No sensation of fluid in the ear. No recent fevers, illnesses. Pain does not radiate. Has been evaluated before.   Also requests pill form antihistamine for her allergies.  PERTINENT  PMH / PSH: Insomnia, allergies, asthma  OBJECTIVE:   BP 120/82   Pulse 81   Ht 5\' 6"  (1.676 m)   Wt 181 lb 6.4 oz (82.3 kg)   LMP 05/20/2023   SpO2 100%   BMI 29.28 kg/m   General: A&O, NAD HEENT: No sign of trauma, EOM grossly intact. R external ear canal red and mildly excoriated. TM normal bilaterally Cardiac: RRR, no m/r/g Respiratory: CTAB, normal WOB, no w/c/r Extremities: NTTP, no peripheral edema.  Pulses 2+ in all 4 extremities   ASSESSMENT/PLAN:   Allergies Discontinued Flonase and ordered cetirizine 10 mg daily.  Itching of ear This has been an ongoing problem for the patient.  Given low suspicion for infectious etiology I have prescribed VoSol eardrops to be used as needed in the evening when the itching is most bothersome.  Colon cancer screening Given patient's low risk and negative familial and personal history for colon cancer, patient and I decided to order Cologuard for her.  She will complete based on instructions included in the testing kit.   Gerrit Heck, DO [facility]

## 2023-06-02 NOTE — Assessment & Plan Note (Signed)
Discontinued Flonase and ordered cetirizine 10 mg daily.

## 2023-06-02 NOTE — Patient Instructions (Signed)
It was wonderful to see you today.  Today we talked about:  Your ear pain and allergies.  I prescribed for you VoSol eardrops to help with the itching.  2 drops in your right ear at bedtime every night as needed until itching resolves.  We also started you on daily cetirizine (Zyrtec) 10 mg to help with your allergies.  If you experience excessive drowsiness or other bothersome symptoms please let me know.  I have also ordered Cologuard for you to start with colon cancer screening.  It should be available for you in the next few days and have instructions on how to complete the screening.  Thank you for choosing [facility].   Please call [phone] with any questions about today's appointment.  Please arrive at least 15 minutes prior to your scheduled appointments.   If you had blood work today, I will send you a MyChart message or a letter if results are normal. Otherwise, I will give you a call.   If you had a referral placed, they will call you to set up an appointment. Please give [place] a call if you don't hear back in the next 2 weeks.   If you need additional refills before your next appointment, please call your pharmacy first.   Gerrit Heck, DO Family Medicine

## 2023-06-15 DIAGNOSIS — Z1211 Encounter for screening for malignant neoplasm of colon: Secondary | ICD-10-CM | POA: Diagnosis not present

## 2023-06-24 ENCOUNTER — Encounter: Payer: Self-pay | Admitting: Family Medicine

## 2023-09-06 ENCOUNTER — Other Ambulatory Visit: Payer: Self-pay

## 2023-09-06 ENCOUNTER — Other Ambulatory Visit (HOSPITAL_COMMUNITY): Payer: Self-pay

## 2023-09-10 ENCOUNTER — Encounter (HOSPITAL_BASED_OUTPATIENT_CLINIC_OR_DEPARTMENT_OTHER): Payer: 59 | Admitting: Certified Nurse Midwife

## 2023-09-22 ENCOUNTER — Encounter: Payer: Self-pay | Admitting: Emergency Medicine

## 2023-09-22 ENCOUNTER — Ambulatory Visit
Admission: EM | Admit: 2023-09-22 | Discharge: 2023-09-22 | Disposition: A | Discharge status: Home / Self Care | Payer: 59 | Attending: Internal Medicine | Admitting: Internal Medicine

## 2023-09-22 DIAGNOSIS — J309 Allergic rhinitis, unspecified: Secondary | ICD-10-CM | POA: Diagnosis not present

## 2023-09-22 DIAGNOSIS — J4530 Mild persistent asthma, uncomplicated: Secondary | ICD-10-CM

## 2023-09-22 DIAGNOSIS — J988 Other specified respiratory disorders: Secondary | ICD-10-CM | POA: Diagnosis not present

## 2023-09-22 DIAGNOSIS — B9789 Other viral agents as the cause of diseases classified elsewhere: Secondary | ICD-10-CM

## 2023-09-22 MED ORDER — ALBUTEROL SULFATE HFA 108 (90 BASE) MCG/ACT IN AERS: 2.0000 | INHALATION_SPRAY | RESPIRATORY_TRACT | 0 refills | Status: DC | PRN

## 2023-09-22 MED ORDER — ACETAMINOPHEN 325 MG PO TABS: 650.0000 mg | ORAL_TABLET | Freq: Four times a day (QID) | ORAL | 0 refills | Status: DC | PRN

## 2023-09-22 MED ORDER — PROMETHAZINE-DM 6.25-15 MG/5ML PO SYRP: 5.0000 mL | ORAL_SOLUTION | Freq: Three times a day (TID) | ORAL | 0 refills | Status: DC | PRN

## 2023-09-22 MED ORDER — PREDNISONE 20 MG PO TABS: ORAL_TABLET | ORAL | 0 refills | Status: DC

## 2023-09-22 NOTE — Discharge Instructions (Signed)
We will manage this as a viral respiratory illness likely worsened by your allergies and asthma. For sore throat or cough try using a honey-based tea. Use 3 teaspoons of honey with juice squeezed from half lemon. Place shaved pieces of ginger into 1/2-1 cup of water and warm over stove top. Then mix the ingredients and repeat every 4 hours as needed. Please take Tylenol 650mg  once every 6 hours for fevers, aches and pains. Hydrate very well with at least 2 liters (64 ounces) of water. Eat light meals such as soups (chicken and noodles, chicken wild rice, vegetable).  Do not eat any foods that you are allergic to.  Start an antihistamine like Zyrtec (10mg  daily) for postnasal drainage, sinus congestion.  You can take this together with prednisone and albuterol. Use cough syrup as needed.

## 2023-09-22 NOTE — ED Triage Notes (Signed)
Since Friday pt had body aches, cough, neck and throat pain, congestion and headaches. Taken OTC medications that only help short time.

## 2023-09-22 NOTE — ED Provider Notes (Signed)
[facility]  Note:  This document was prepared using Conservation officer, historic buildings and may include unintentional dictation errors.  MRN: [id number] DOB: 1976/05/31  Subjective:   Brooke DACRUZ is a 47 y.o. female presenting for 6-day history of persistent body aches, coughing, throat pain, sinus congestion and sinus headaches.  Has felt chest tightness, had coughing spells.  Has used over-the-counter medications with minimal relief.  Needs a refill of her albuterol inhaler.  No smoking of any kind including cigarettes, cigars, vaping, marijuana use.    No current facility-administered medications for this encounter.  Current Outpatient Medications:    acetic acid-hydrocortisone (VOSOL-HC) OTIC solution, Place 2 drops into the right ear at bedtime as needed., Disp: 10 mL, Rfl: 0   albuterol (VENTOLIN HFA) 108 (90 Base) MCG/ACT inhaler, Inhale 2-4 puffs into the lungs every 4 (four) hours as needed for wheezing (or cough)., Disp: 20.1 g, Rfl: 2   budesonide-formoterol (SYMBICORT) 80-4.5 MCG/ACT inhaler, Inhale 2 puffs into the lungs 2 (two) times daily. (Patient not taking: Reported on 05/29/2022), Disp: 1 Inhaler, Rfl: 3   cetirizine (ZYRTEC) 10 MG tablet, Take 1 tablet (10 mg total) by mouth daily., Disp: 100 tablet, Rfl: 3   fluticasone (FLONASE) 50 MCG/ACT nasal spray, Place 2 sprays into both nostrils daily., Disp: 16 g, Rfl: 1   montelukast (SINGULAIR) 10 MG tablet, Take 1 tablet (10 mg total) by mouth at bedtime. (Patient not taking: Reported on 05/29/2022), Disp: 90 tablet, Rfl: 1   traZODone (DESYREL) 50 MG tablet, Take 1/2-1 tablet (25-50 mg total) by mouth at bedtime as needed for sleep., Disp: 30 tablet, Rfl: 0   Allergies  Allergen Reactions   Penicillins Nausea Only and Other (See Comments)    dizziness    Past Medical History:  Diagnosis Date   Asthma      Past Surgical History:  Procedure Laterality Date   BTL     TUBAL LIGATION  Bilateral     Family History  Problem Relation Age of Onset   Hypertension Maternal Aunt    Diabetes Maternal Grandmother     Social History   Tobacco Use   Smoking status: Never   Smokeless tobacco: Never  Substance Use Topics   Alcohol use: Yes    Comment: social   Drug use: No    ROS   Objective:   Vitals: BP (!) 144/86 (BP Location: Left Arm)   Pulse 89   Temp 97.7 F (36.5 C) (Oral)   Resp 20   LMP 08/26/2023   SpO2 94%   Physical Exam Constitutional:      General: She is not in acute distress.    Appearance: Normal appearance. She is well-developed and normal weight. She is not ill-appearing, toxic-appearing or diaphoretic.  HENT:     Head: Normocephalic and atraumatic.     Right Ear: Tympanic membrane, ear canal and external ear normal. No drainage or tenderness. No middle ear effusion. There is no impacted cerumen. Tympanic membrane is not erythematous or bulging.     Left Ear: Tympanic membrane, ear canal and external ear normal. No drainage or tenderness.  No middle ear effusion. There is no impacted cerumen. Tympanic membrane is not erythematous or bulging.     Nose: Nose normal. No congestion or rhinorrhea.     Mouth/Throat:     Mouth: Mucous membranes are moist. No oral lesions.     Pharynx: No pharyngeal swelling, oropharyngeal exudate, posterior oropharyngeal erythema or uvula  swelling.     Tonsils: No tonsillar exudate or tonsillar abscesses.  Eyes:     General: No scleral icterus.       Right eye: No discharge.        Left eye: No discharge.     Extraocular Movements: Extraocular movements intact.     Right eye: Normal extraocular motion.     Left eye: Normal extraocular motion.     Conjunctiva/sclera: Conjunctivae normal.  Cardiovascular:     Rate and Rhythm: Normal rate and regular rhythm.     Heart sounds: Normal heart sounds. No murmur heard.    No friction rub. No gallop.  Pulmonary:     Effort: Pulmonary effort is normal. No  respiratory distress.     Breath sounds: No stridor. No wheezing, rhonchi or rales.  Chest:     Chest wall: No tenderness.  Musculoskeletal:     Cervical back: Normal range of motion and neck supple.  Lymphadenopathy:     Cervical: No cervical adenopathy.  Skin:    General: Skin is warm and dry.  Neurological:     General: No focal deficit present.     Mental Status: She is alert and oriented to person, place, and time.  Psychiatric:        Mood and Affect: Mood normal.        Behavior: Behavior normal.     Assessment and Plan :   PDMP not reviewed this encounter.  1. Viral respiratory illness   2. Allergic rhinitis, unspecified seasonality, unspecified trigger   3. Mild persistent asthma, uncomplicated   4. Mild persistent asthma without complication    Recommended a oral prednisone course in the context of her allergic rhinitis, asthma.  Refilled her albuterol.  Use supportive care otherwise for an acute viral respiratory illness.  Deferred imaging given clear cardiopulmonary exam, hemodynamically stable vital signs.  Counseled patient on potential for adverse effects with medications prescribed/recommended today, ER and return-to-clinic precautions discussed, patient verbalized understanding.    Brooke [name], [place] 09/23/23 [phone]

## 2023-10-21 ENCOUNTER — Other Ambulatory Visit (HOSPITAL_BASED_OUTPATIENT_CLINIC_OR_DEPARTMENT_OTHER): Payer: Self-pay

## 2023-10-21 ENCOUNTER — Ambulatory Visit (HOSPITAL_BASED_OUTPATIENT_CLINIC_OR_DEPARTMENT_OTHER): Payer: 59 | Admitting: Certified Nurse Midwife

## 2023-10-21 ENCOUNTER — Other Ambulatory Visit (HOSPITAL_COMMUNITY): Payer: Self-pay

## 2023-10-21 ENCOUNTER — Encounter (HOSPITAL_BASED_OUTPATIENT_CLINIC_OR_DEPARTMENT_OTHER): Payer: Self-pay | Admitting: Certified Nurse Midwife

## 2023-10-21 ENCOUNTER — Other Ambulatory Visit: Payer: Self-pay | Admitting: Obstetrics & Gynecology

## 2023-10-21 VITALS — BP 131/92 | HR 82 | Ht 66.0 in | Wt 191.0 lb

## 2023-10-21 DIAGNOSIS — N951 Menopausal and female climacteric states: Secondary | ICD-10-CM | POA: Diagnosis not present

## 2023-10-21 DIAGNOSIS — N926 Irregular menstruation, unspecified: Secondary | ICD-10-CM | POA: Diagnosis not present

## 2023-10-21 LAB — CBC
Hematocrit: 31.8 % — ABNORMAL LOW (ref 34.0–46.6)
Hemoglobin: 9.6 g/dL — ABNORMAL LOW (ref 11.1–15.9)
MCH: 22.9 pg — ABNORMAL LOW (ref 26.6–33.0)
MCHC: 30.2 g/dL — ABNORMAL LOW (ref 31.5–35.7)
MCV: 76 fL — ABNORMAL LOW (ref 79–97)
Platelets: 201 10*3/uL (ref 150–450)
RBC: 4.2 x10E6/uL (ref 3.77–5.28)
RDW: 15.9 % — ABNORMAL HIGH (ref 11.7–15.4)
WBC: 5.4 10*3/uL (ref 3.4–10.8)

## 2023-10-21 MED ORDER — NORETHINDRONE 0.35 MG PO TABS
1.0000 | ORAL_TABLET | Freq: Every day | ORAL | 11 refills | Status: DC
  Filled 2023-10-21 (×2): qty 84, 84d supply, fill #0

## 2023-10-21 NOTE — Progress Notes (Signed)
Pap 03/26/20 Negative, HPV Negative Cologuard Negative (06/15/23) PCP: Dr Gerrit Heck  Brooke [name] is a [phone] Married G3P4 (one set twins), hx 4 vaginal deliveries, here for new problem gyn visit due to irregular periods.  She had BTL after birth of her Twins 23 years ago. Periods were typically every 28 days lasting 5 days and not heavy or painful. Starting around March 2024, her periods starting to come more frequent than usually, sometimes every 3 weeks and can last up to 2 weeks at times. She reports occasional hot flashes, night sweats, and issue with insomnia.    GYNECOLOGY  VISIT  CC:   polymenorrhea  HPI: 47 y.o. [id number] Married Brooke [name] female here for new problem gyn visit due to frequent menses.   Past Medical History:  Diagnosis Date   Asthma     MEDS:   Current Outpatient Medications on File Prior to Visit  Medication Sig Dispense Refill   acetaminophen (TYLENOL) 325 MG tablet Take 2 tablets (650 mg total) by mouth every 6 (six) hours as needed for moderate pain (pain score 4-6). 30 tablet 0   acetic acid-hydrocortisone (VOSOL-HC) OTIC solution Place 2 drops into the right ear at bedtime as needed. 10 mL 0   albuterol (VENTOLIN HFA) 108 (90 Base) MCG/ACT inhaler Inhale 2-4 puffs into the lungs every 4 (four) hours as needed for wheezing (or cough). 18 g 0   budesonide-formoterol (SYMBICORT) 80-4.5 MCG/ACT inhaler Inhale 2 puffs into the lungs 2 (two) times daily. 1 Inhaler 3   cetirizine (ZYRTEC) 10 MG tablet Take 1 tablet (10 mg total) by mouth daily. 100 tablet 3   fluticasone (FLONASE) 50 MCG/ACT nasal spray Place 2 sprays into both nostrils daily. 16 g 1   montelukast (SINGULAIR) 10 MG tablet Take 1 tablet (10 mg total) by mouth at bedtime. 90 tablet 1   promethazine-dextromethorphan (PROMETHAZINE-DM) 6.25-15 MG/5ML syrup Take 5 mLs by mouth 3 (three) times daily as needed for cough. 200 mL 0   traZODone (DESYREL) 50 MG tablet Take 1/2-1 tablet (25-50 mg  total) by mouth at bedtime as needed for sleep. 30 tablet 0   No current facility-administered medications on file prior to visit.    ALLERGIES: Penicillins  SH:  Works for [name], works from home, 4 healthy children, 3 grandchildren, lives with spouse    PHYSICAL EXAMINATION:    BP (!) 131/92 (BP Location: Right Arm, Patient Position: Sitting, Cuff Size: Normal)   Pulse 82   Ht 5\' 6"  (1.676 m)   Wt 191 lb (86.6 kg)   LMP 10/09/2023   BMI 30.83 kg/m     General appearance: alert, cooperative and appears stated age          Assessment/Plan: 1. Irregular menses (Primary); AUB; Perimenopausal - CBC - Pt will RTO for Pelvic GYN [place] to evaluate uterus/ovaries/rule out fibroids. Pt considering Mirena IUD Insertion. Discussed endometrial biopsy after [place] if any abnormality  noted with endometrial lining. - We discussed all options for management of AUB. Pt encouraged to consider Mirena IUD. Discussed estrogen/progesterone vs. Progesterone only  medications. She will start Norethindrone nightly until ultrasound. Will evaluate effectiveness of Norethindrone at [place] appointment. -Pt verbalized understanding. Will RTO for [place] and possible insertion Mirena IUD, possible endometrial biopsy.  [name]

## 2023-11-17 ENCOUNTER — Other Ambulatory Visit: Payer: Self-pay | Admitting: Family Medicine

## 2023-11-17 ENCOUNTER — Encounter: Payer: Self-pay | Admitting: Family Medicine

## 2023-11-17 ENCOUNTER — Other Ambulatory Visit (HOSPITAL_COMMUNITY): Payer: Self-pay

## 2023-11-17 DIAGNOSIS — J4530 Mild persistent asthma, uncomplicated: Secondary | ICD-10-CM

## 2023-11-17 MED ORDER — ALBUTEROL SULFATE HFA 108 (90 BASE) MCG/ACT IN AERS
2.0000 | INHALATION_SPRAY | RESPIRATORY_TRACT | 2 refills | Status: DC | PRN
  Filled 2023-11-17: qty 20.1, 75d supply, fill #0
  Filled 2024-03-21: qty 20.1, 75d supply, fill #1

## 2023-11-19 ENCOUNTER — Other Ambulatory Visit (HOSPITAL_COMMUNITY): Payer: Self-pay

## 2023-11-30 ENCOUNTER — Other Ambulatory Visit (HOSPITAL_COMMUNITY): Payer: Self-pay

## 2023-12-01 ENCOUNTER — Ambulatory Visit (HOSPITAL_BASED_OUTPATIENT_CLINIC_OR_DEPARTMENT_OTHER): Payer: 59

## 2023-12-01 ENCOUNTER — Ambulatory Visit (HOSPITAL_BASED_OUTPATIENT_CLINIC_OR_DEPARTMENT_OTHER): Payer: 59 | Admitting: Obstetrics & Gynecology

## 2023-12-01 ENCOUNTER — Ambulatory Visit (HOSPITAL_BASED_OUTPATIENT_CLINIC_OR_DEPARTMENT_OTHER): Payer: 59 | Admitting: Certified Nurse Midwife

## 2023-12-01 ENCOUNTER — Other Ambulatory Visit (HOSPITAL_BASED_OUTPATIENT_CLINIC_OR_DEPARTMENT_OTHER): Payer: Self-pay | Admitting: Obstetrics & Gynecology

## 2023-12-01 VITALS — BP 123/74 | HR 68 | Ht 66.0 in | Wt 192.0 lb

## 2023-12-01 DIAGNOSIS — N939 Abnormal uterine and vaginal bleeding, unspecified: Secondary | ICD-10-CM

## 2023-12-01 DIAGNOSIS — N920 Excessive and frequent menstruation with regular cycle: Secondary | ICD-10-CM | POA: Diagnosis not present

## 2023-12-01 DIAGNOSIS — N926 Irregular menstruation, unspecified: Secondary | ICD-10-CM

## 2023-12-01 NOTE — Progress Notes (Unsigned)
   Brooke [name] is a 48yo G3P4 here for Pelvic GYN [place]. She was evaluated 10/21/23 and reported periods "starting to come more frequently than usual". Pt reported periods were historically every 28 days lasting 5 days. Periods have started to "come every 3 weeks sometimes" and "I can bleed up to 2 weeks at times". Periods are not heavy or painful.  [place] today: Pelvic structures imaged in sagittal and transverse orientation transvaginally. Anteverted uterus appears normal in size without evidence of focal abnormalities. Endometrial cavity: ?Polyp vs. Fibroid in the cavity. Blood is noted in this area 2.5 x 1.9cm. Ovaries appear normal bilaterally. Each ovary has small follicle. Negative adnexal regions bilaterally. Neg cul de sac. No free fluid present. CXL wnl.  Ultrasound findings reviewed with Dr. Hyacinth Meeker. Able to add patient on at 10:30am this morning for Sonohysterogram. Pt agreeable. She will return at 10:30 am for [place].

## 2023-12-31 ENCOUNTER — Other Ambulatory Visit (HOSPITAL_BASED_OUTPATIENT_CLINIC_OR_DEPARTMENT_OTHER): Payer: Self-pay | Admitting: Obstetrics & Gynecology

## 2023-12-31 DIAGNOSIS — D250 Submucous leiomyoma of uterus: Secondary | ICD-10-CM

## 2023-12-31 DIAGNOSIS — N920 Excessive and frequent menstruation with regular cycle: Secondary | ICD-10-CM

## 2024-02-09 ENCOUNTER — Telehealth: Payer: Self-pay

## 2024-02-09 NOTE — Telephone Encounter (Signed)
 I called patient to see if she was available for surgery w/ Dr. Hyacinth Meeker on 03/14/24 @7 :30 am/[facility]. I left a voicemail w/ details and asked patient to call me back to schedule at [phone].

## 2024-02-09 NOTE — Telephone Encounter (Signed)
 Patient returned my call and requested to be scheduled on 04/24/24 w/ Dr. Hyacinth Meeker at 1 pm. I provided pre-op instructions and surgery details by phone. Written confirmation will be sent to patient's Mychart acct.

## 2024-02-12 ENCOUNTER — Other Ambulatory Visit (HOSPITAL_BASED_OUTPATIENT_CLINIC_OR_DEPARTMENT_OTHER): Payer: Self-pay | Admitting: Obstetrics & Gynecology

## 2024-02-12 DIAGNOSIS — D250 Submucous leiomyoma of uterus: Secondary | ICD-10-CM

## 2024-02-12 DIAGNOSIS — N920 Excessive and frequent menstruation with regular cycle: Secondary | ICD-10-CM

## 2024-02-12 DIAGNOSIS — Z01818 Encounter for other preprocedural examination: Secondary | ICD-10-CM

## 2024-02-16 ENCOUNTER — Encounter (HOSPITAL_BASED_OUTPATIENT_CLINIC_OR_DEPARTMENT_OTHER): Payer: Self-pay

## 2024-02-16 ENCOUNTER — Telehealth (HOSPITAL_BASED_OUTPATIENT_CLINIC_OR_DEPARTMENT_OTHER): Payer: Self-pay | Admitting: *Deleted

## 2024-02-16 NOTE — Telephone Encounter (Signed)
 LMOVM for pt to call to set up appt for labs and pre op with Dr. Hyacinth Meeker

## 2024-02-16 NOTE — Telephone Encounter (Signed)
-----   Message from Jerene Bears sent at 02/12/2024 10:51 AM EDT ----- Regarding: FW: ##Surgery 04/24/24## Brooke [name], Pt scheduled in June for hysteroscopy with fibroid resection.  Last hb was 9.6 and was in December. I've placed labs for CBC with iron levels to see if she needs any iron treatment prior to surgery to get her hemoglobin better before surgery.  Can you have her come in for labs please and she really needs a formal pre op as she saw Lupita Leash primarily that day.  Labs soon are most important.  Can do pre op in May.  Thanks.  MSM ----- Message ----- From: Donne Hazel Sent: 02/09/2024   5:50 PM EDT To: Harrie Jeans, RN; Jerene Bears, MD; # Subject: ##Surgery 04/24/24##                            Done.  Posted for 04/24/24 @[facility] w/ Dr. Hyacinth Meeker at 1300. [id number]

## 2024-03-02 ENCOUNTER — Telehealth: Payer: Self-pay

## 2024-03-02 NOTE — Telephone Encounter (Signed)
 Patient called to cancel her 04/24/24 procedure w/ Dr. Annabell Key. Patient states something has come up and she'd like to push the procedure out. Patient will reschedule once Dr. Annabell Key July surgery schedule is available.

## 2024-03-09 ENCOUNTER — Encounter (HOSPITAL_BASED_OUTPATIENT_CLINIC_OR_DEPARTMENT_OTHER): Admitting: Obstetrics & Gynecology

## 2024-03-21 ENCOUNTER — Other Ambulatory Visit: Payer: Self-pay | Admitting: Family Medicine

## 2024-03-21 ENCOUNTER — Telehealth: Payer: Self-pay

## 2024-03-21 ENCOUNTER — Other Ambulatory Visit (HOSPITAL_COMMUNITY): Payer: Self-pay

## 2024-03-21 ENCOUNTER — Other Ambulatory Visit: Payer: Self-pay

## 2024-03-21 DIAGNOSIS — F409 Phobic anxiety disorder, unspecified: Secondary | ICD-10-CM

## 2024-03-21 NOTE — Telephone Encounter (Signed)
 Patient left a voicemail requesting a callback to reschedule surgery w/ Dr. Annabell Key. I called and scheduled patient w/ Dr. Annabell Key on 05/30/24 at [facility] at 1:00 pm. I provided pre-op instructions and surgery details by phone. Written confirmation is also being sent to patient's Mychart acct.

## 2024-03-22 ENCOUNTER — Other Ambulatory Visit (HOSPITAL_COMMUNITY): Payer: Self-pay

## 2024-03-22 ENCOUNTER — Other Ambulatory Visit: Payer: Self-pay

## 2024-03-22 MED ORDER — TRAZODONE HCL 50 MG PO TABS
25.0000 mg | ORAL_TABLET | Freq: Every evening | ORAL | 0 refills | Status: DC | PRN
  Filled 2024-03-22: qty 30, 30d supply, fill #0

## 2024-03-31 ENCOUNTER — Encounter (HOSPITAL_BASED_OUTPATIENT_CLINIC_OR_DEPARTMENT_OTHER): Payer: Self-pay

## 2024-04-12 ENCOUNTER — Other Ambulatory Visit (HOSPITAL_COMMUNITY)
Admission: RE | Admit: 2024-04-12 | Discharge: 2024-04-12 | Disposition: A | Discharge status: Home / Self Care | Source: Ambulatory Visit | Attending: Obstetrics & Gynecology | Admitting: Obstetrics & Gynecology

## 2024-04-12 ENCOUNTER — Other Ambulatory Visit (HOSPITAL_BASED_OUTPATIENT_CLINIC_OR_DEPARTMENT_OTHER): Payer: Self-pay | Admitting: Obstetrics & Gynecology

## 2024-04-12 ENCOUNTER — Ambulatory Visit (INDEPENDENT_AMBULATORY_CARE_PROVIDER_SITE_OTHER): Admitting: Obstetrics & Gynecology

## 2024-04-12 ENCOUNTER — Encounter (HOSPITAL_BASED_OUTPATIENT_CLINIC_OR_DEPARTMENT_OTHER): Payer: Self-pay | Admitting: Obstetrics & Gynecology

## 2024-04-12 VITALS — BP 123/88 | HR 83 | Ht 66.0 in | Wt 193.0 lb

## 2024-04-12 DIAGNOSIS — N921 Excessive and frequent menstruation with irregular cycle: Secondary | ICD-10-CM

## 2024-04-12 DIAGNOSIS — N939 Abnormal uterine and vaginal bleeding, unspecified: Secondary | ICD-10-CM

## 2024-04-12 DIAGNOSIS — Z01818 Encounter for other preprocedural examination: Secondary | ICD-10-CM | POA: Diagnosis not present

## 2024-04-12 DIAGNOSIS — D250 Submucous leiomyoma of uterus: Secondary | ICD-10-CM

## 2024-04-12 DIAGNOSIS — Z124 Encounter for screening for malignant neoplasm of cervix: Secondary | ICD-10-CM

## 2024-04-12 DIAGNOSIS — Z1231 Encounter for screening mammogram for malignant neoplasm of breast: Secondary | ICD-10-CM

## 2024-04-12 NOTE — Progress Notes (Unsigned)
 48 y.o. [id number] Single Black or [place] American female here for discussion of upcoming procedure.  Currently, hysteroscopy with resection of fibroids is planned.  She continues to have heavy bleeding and is asking if more definitive surgery can be planned instead of hysteroscopy.  She is just tired of bleeding.  Evaluation has included ultrasound howing uterus measuring 11 x 6 x 5cm with endometrial mass measurng 2.5cm that is consistent with submucosal fibroid.  Endometrial biopsy is planned for today and we need to update her pap smear as well.  She is on oral progesterone and it has helped modestly but she continues to have heavy bleeding.  We did review the differences in surgery including risks and recovery.  Specifically risks discuss include but not limited to bleeding, 1% risk of receiving a  transfusion, infection, 3-4% risk of bowel/bladder/ureteral/vascular injury discussed as well as possible need for additional surgery if injury does occur discussed.  DVT/PE and rare risk of death discussed.  My actual complications with prior surgeries discussed.  Vaginal cuff dehiscence discussed.  Hernia formation discussed.  Positioning and incision locations discussed.  She would prefer to change surgery to hysterectomy if possible.     Ob Hx:   Patient's last menstrual period was 03/13/2024.          Birth control: bilateral tubal ligation Last pap: updating today.  Last one was 03/26/2020.   Last MMG: order placed Smoking: No   Past Surgical History:  Procedure Laterality Date   BTL     TUBAL LIGATION Bilateral     Past Medical History:  Diagnosis Date   Asthma     Allergies: Penicillins  Current Outpatient Medications  Medication Sig Dispense Refill   acetaminophen  (TYLENOL ) 325 MG tablet Take 2 tablets (650 mg total) by mouth every 6 (six) hours as needed for moderate pain (pain score 4-6). 30 tablet 0   acetic acid -hydrocortisone  (VOSOL -HC) OTIC solution Place 2 drops into the  right ear at bedtime as needed. 10 mL 0   albuterol  (VENTOLIN  HFA) 108 (90 Base) MCG/ACT inhaler Inhale 2-4 puffs into the lungs every 4 (four) hours as needed for wheezing (or cough). 18 g 0   albuterol  (VENTOLIN  HFA) 108 (90 Base) MCG/ACT inhaler Inhale 2-4 puffs into the lungs every 4 (four) hours as needed for wheezing (or cough). 20.1 g 2   budesonide -formoterol  (SYMBICORT ) 80-4.5 MCG/ACT inhaler Inhale 2 puffs into the lungs 2 (two) times daily. 1 Inhaler 3   cetirizine  (ZYRTEC ) 10 MG tablet Take 1 tablet (10 mg total) by mouth daily. 100 tablet 3   fluticasone  (FLONASE ) 50 MCG/ACT nasal spray Place 2 sprays into both nostrils daily. 16 g 1   montelukast  (SINGULAIR ) 10 MG tablet Take 1 tablet (10 mg total) by mouth at bedtime. 90 tablet 1   norethindrone  (MICRONOR ) 0.35 MG tablet Take 1 tablet (0.35 mg total) by mouth daily. 84 tablet 11   promethazine -dextromethorphan (PROMETHAZINE -DM) 6.25-15 MG/5ML syrup Take 5 mLs by mouth 3 (three) times daily as needed for cough. 200 mL 0   traZODone  (DESYREL ) 50 MG tablet Take 1/2-1 tablet (25-50 mg total) by mouth at bedtime as needed for sleep. 30 tablet 0   No current facility-administered medications for this visit.    ROS: Genitourinary:positive for heavy bleeding  Exam:   BP 123/88 (BP Location: Right Arm, Patient Position: Sitting)   Pulse 83   Ht 5\' 6"  (1.676 m)   Wt 193 lb (87.5 kg)   LMP 03/13/2024  BMI 31.15 kg/m   General appearance: alert and cooperative Head: Normocephalic, without obvious abnormality, atraumatic Neck: no adenopathy, supple, symmetrical, trachea midline and thyroid not enlarged, symmetric, no tenderness/mass/nodules Lungs: clear to auscultation bilaterally Heart: regular rate and rhythm, S1, S2 normal, no murmur, click, rub or gallop Abdomen: soft, non-tender; bowel sounds normal; no masses,  no organomegaly Extremities: extremities normal, atraumatic, no cyanosis or edema Skin: Skin color, texture, turgor  normal. No rashes or lesions Lymph nodes: Cervical, supraclavicular, and axillary nodes normal. no inguinal nodes palpated Neurologic: Grossly normal  Pelvic: External genitalia:  no lesions              Urethra: normal appearing urethra with no masses, tenderness or lesions              Bartholins and Skenes: normal                 Vagina: normal appearing vagina with normal color and discharge, no lesions              Cervix: normal appearance              Pap taken: Yes.          Bimanual Exam:  Uterus:  uterus is normal size, shape, consistency and nontender                                      Adnexa:    normal adnexa in size, nontender and no masses                                      Rectovaginal: Deferred                                      Anus:  no lesion  Endometrial biopsy recommended.  Discussed with patient.  Verbal and written consent obtained.   Procedure:  Speculum placed.  Cervix visualized and cleansed with betadine prep.  A single toothed tenaculum was applied to the anterior lip of the cervix.  Endometrial pipelle was advanced through the cervix into the endometrial cavity without difficulty.  Pipelle passed to 8cm.  Suction applied and pipelle removed with good tissue sample obtained.  Tenculum removed.  No bleeding noted.  Patient tolerated procedure well.  Chaperone, Myrtie Atkinson, CMA, present for examination.   Assessment/Plan: 1.  Menorrhagia with irregular cycle - CBC - Iron, TIBC and Ferritin Panel - Surgical pathology( [place]/ [facility]) - continue oral progesterone  2. Pre-op evaluation - will look at schedule and see when can accommodate change in time for surgery and communicate back with pt - CBC - Iron, TIBC and Ferritin Panel  3. Cervical cancer screening - Cytology - PAP( [place])  4. Encounter for screening mammogram for malignant neoplasm of breast - pt needs to update this prior to surgery - MM 3D SCREENING MAMMOGRAM BILATERAL  BREAST; Future

## 2024-04-13 LAB — IRON,TIBC AND FERRITIN PANEL
Ferritin: 15 ng/mL (ref 15–150)
Iron Saturation: 9 % — CL (ref 15–55)
Iron: 31 ug/dL (ref 27–159)
Total Iron Binding Capacity: 328 ug/dL (ref 250–450)
UIBC: 297 ug/dL (ref 131–425)

## 2024-04-13 LAB — CBC
Hematocrit: 37.9 % (ref 34.0–46.6)
Hemoglobin: 12.2 g/dL (ref 11.1–15.9)
MCH: 28.6 pg (ref 26.6–33.0)
MCHC: 32.2 g/dL (ref 31.5–35.7)
MCV: 89 fL (ref 79–97)
Platelets: 257 10*3/uL (ref 150–450)
RBC: 4.27 x10E6/uL (ref 3.77–5.28)
RDW: 13.8 % (ref 11.7–15.4)
WBC: 6.1 10*3/uL (ref 3.4–10.8)

## 2024-04-13 LAB — SURGICAL PATHOLOGY

## 2024-04-14 ENCOUNTER — Other Ambulatory Visit (HOSPITAL_BASED_OUTPATIENT_CLINIC_OR_DEPARTMENT_OTHER): Payer: Self-pay | Admitting: Obstetrics & Gynecology

## 2024-04-14 ENCOUNTER — Encounter (HOSPITAL_BASED_OUTPATIENT_CLINIC_OR_DEPARTMENT_OTHER): Payer: Self-pay | Admitting: Obstetrics & Gynecology

## 2024-04-14 ENCOUNTER — Ambulatory Visit (HOSPITAL_BASED_OUTPATIENT_CLINIC_OR_DEPARTMENT_OTHER): Payer: Self-pay | Admitting: Obstetrics & Gynecology

## 2024-04-14 DIAGNOSIS — D250 Submucous leiomyoma of uterus: Secondary | ICD-10-CM

## 2024-04-14 DIAGNOSIS — N921 Excessive and frequent menstruation with irregular cycle: Secondary | ICD-10-CM

## 2024-04-14 DIAGNOSIS — Z862 Personal history of diseases of the blood and blood-forming organs and certain disorders involving the immune mechanism: Secondary | ICD-10-CM

## 2024-04-14 DIAGNOSIS — N939 Abnormal uterine and vaginal bleeding, unspecified: Secondary | ICD-10-CM | POA: Insufficient documentation

## 2024-04-18 LAB — CYTOLOGY - PAP
Comment: NEGATIVE
Diagnosis: NEGATIVE
Diagnosis: REACTIVE
High risk HPV: NEGATIVE

## 2024-04-19 ENCOUNTER — Other Ambulatory Visit (HOSPITAL_COMMUNITY): Payer: Self-pay

## 2024-04-19 ENCOUNTER — Telehealth: Payer: Self-pay

## 2024-04-19 MED ORDER — METRONIDAZOLE 500 MG PO TABS
500.0000 mg | ORAL_TABLET | Freq: Two times a day (BID) | ORAL | 0 refills | Status: DC
  Filled 2024-04-19: qty 14, 7d supply, fill #0

## 2024-04-19 NOTE — Telephone Encounter (Signed)
 I called patient to confirm she would like to move her surgery date w/ Dr. Annabell Key to 06/13/24? Patient confirmed the procedure has changed and she's willing to wait until 06/13/24 at 7:30 am. Surgery details and pre-op instructions were provided by phone.

## 2024-04-20 ENCOUNTER — Other Ambulatory Visit (HOSPITAL_COMMUNITY): Payer: Self-pay

## 2024-04-24 ENCOUNTER — Ambulatory Visit (HOSPITAL_COMMUNITY): Admit: 2024-04-24 | Admitting: Obstetrics & Gynecology

## 2024-04-25 ENCOUNTER — Other Ambulatory Visit (HOSPITAL_BASED_OUTPATIENT_CLINIC_OR_DEPARTMENT_OTHER): Payer: Self-pay | Admitting: Obstetrics & Gynecology

## 2024-04-25 DIAGNOSIS — Z01818 Encounter for other preprocedural examination: Secondary | ICD-10-CM

## 2024-04-25 DIAGNOSIS — N921 Excessive and frequent menstruation with irregular cycle: Secondary | ICD-10-CM

## 2024-05-19 ENCOUNTER — Ambulatory Visit (HOSPITAL_BASED_OUTPATIENT_CLINIC_OR_DEPARTMENT_OTHER): Admitting: Radiology

## 2024-05-20 ENCOUNTER — Ambulatory Visit (HOSPITAL_BASED_OUTPATIENT_CLINIC_OR_DEPARTMENT_OTHER): Admitting: Radiology

## 2024-05-22 ENCOUNTER — Telehealth (HOSPITAL_BASED_OUTPATIENT_CLINIC_OR_DEPARTMENT_OTHER): Payer: Self-pay | Admitting: Obstetrics & Gynecology

## 2024-05-25 ENCOUNTER — Other Ambulatory Visit (HOSPITAL_BASED_OUTPATIENT_CLINIC_OR_DEPARTMENT_OTHER): Payer: Self-pay | Admitting: Certified Nurse Midwife

## 2024-05-30 ENCOUNTER — Ambulatory Visit (HOSPITAL_COMMUNITY): Admit: 2024-05-30 | Admitting: Obstetrics & Gynecology

## 2024-05-30 SURGERY — HYSTEROSCOPY
Anesthesia: Choice

## 2024-06-05 ENCOUNTER — Encounter (HOSPITAL_COMMUNITY): Payer: Self-pay | Admitting: Obstetrics & Gynecology

## 2024-06-05 NOTE — Progress Notes (Signed)
 Spoke w/ via phone for pre-op interview--- pt Lab needs dos----   upt      Lab results------ lab appt 06-09-2024 2 0930 getting CBc/ BMP (gent ordered)/ T&S COVID test -----patient states asymptomatic no test needed Arrive at ------- 0530 on 06-13-2024 NPO after MN NO Solid Food.  Clear liquids from MN until--- 0430 Pre-Surgery Ensure or G2: n/a  Med rec completed Medications to take morning of surgery ----- zyrtec  Diabetic medication ----- n/a  GLP1 agonist last dose: n/a GLP1 instructions:  Patient instructed no nail polish to be worn day of surgery Patient instructed to bring photo id and insurance card day of surgery Patient aware to have Driver (ride ) / caregiver    for 24 hours after surgery - husband, Brooke [name] Patient Special Instructions ----- will pick up bag w/ soap and written instructions at lab appt Pre-Op special Instructions ----- n/a  Patient verbalized understanding of instructions that were given at this phone interview. Patient denies chest pain, sob, fever, cough at the interview.

## 2024-06-05 NOTE — Pre-Procedure Instructions (Signed)
 Surgical Instructions   Your procedure is scheduled on :  Tuesday,  06-13-2024. Report to [facility] Main Entrance A at 5:30  A.M., then check in with the Admitting office. Any questions or running late day of surgery: call [phone]  Questions prior to your surgery date: call [phone], Monday-Friday, 8am-4pm. If you experience any cold or flu symptoms such as cough, fever, chills, shortness of breath, etc. between now and your scheduled surgery, please notify your surgeon office.    Remember:  Do not eat any food after midnight the night before your surgery.  You may have clear liquids from midnight night before surgery until 4:30 AM.  Clear liquids allowed are:  Water Carbonated Beverages Clear Tea (no milk, honey, etc.) Black Coffee Only (NO MILK, CREAM OR POWDERED CREAMER of any kind) Sport drinks, like Gatorade  NO clear liquids after 4:30 AM day of surgery.  This includes no water,  candy,  gum,  and mints.    Take these medicines the morning of surgery with A SIPS OF WATER :  Cetirizine  (zyrtec )   May take these medicines IF NEEDED: Albuterol  (ventolin ) inhaler ~Please bring your inhaler with you day of surgery   One week prior to surgery, STOP taking any Aspirin (unless otherwise instructed by your surgeon) Aleve, Naproxen, Ibuprofen, Motrin, Advil, Goody's, BC's, all herbal medications, fish oil, and non-prescription vitamins.                     Do NOT Smoke (Tobacco/Vaping) and Do Not drink alcohol for 24 hours prior to your procedure.  If you use a CPAP at night, you may bring your mask/headgear for your overnight stay.   You will be asked to remove any contacts, glasses, piercing's, hearing aid's, dentures/partials prior to surgery. Please bring cases for these items if needed.    Patients discharged the day of surgery will not be allowed to drive home, and someone needs to stay with them for 24 hours.  SURGICAL WAITING ROOM VISITATION Patients may  have no more than 2 support people in the waiting area - these visitors may rotate.   Pre-op nurse will coordinate an appropriate time for 1 ADULT support person, who may not rotate, to accompany patient in pre-op.  Children under the age of 63 must have an adult with them who is not the patient and must remain in the main waiting area with an adult.  If the patient needs to stay at the hospital during part of their recovery, the visitor guidelines for inpatient rooms apply.  Please refer to the [facility] website for the visitor guidelines for any additional information.   If you received a COVID test during your pre-op visit  it is requested that you wear a mask when out in public, stay away from anyone that may not be feeling well and notify your surgeon if you develop symptoms. If you have been in contact with anyone that has tested positive in the last 10 days please notify you surgeon.      Pre-operative CHG Bathing Instructions   You can play a key role in reducing the risk of infection after surgery. Your skin needs to be as free of germs as possible. You can reduce the number of germs on your skin by washing with CHG (chlorhexidine gluconate) soap before surgery. CHG is an antiseptic soap that kills germs and continues to kill germs even after washing.   DO NOT use if you have an allergy  to chlorhexidine/CHG or antibacterial soaps. If your skin becomes reddened or irritated, stop using the CHG and notify one of our RNs at .              TAKE A SHOWER THE NIGHT BEFORE SURGERY AND THE DAY OF SURGERY    Please keep in mind the following:  DO NOT shave, including legs and underarms, 48 hours prior to surgery.   You may shave your face before/day of surgery.  Place clean sheets on your bed the night before surgery Use a clean washcloth (not used since being washed) for each shower. DO NOT sleep with pet's night before surgery.  CHG Shower Instructions:  Wash your face and private  area with normal soap. If you choose to wash your hair, wash first with your normal shampoo.  After you use shampoo/soap, rinse your hair and body thoroughly to remove shampoo/soap residue.  Turn the water OFF and apply half the bottle of CHG soap to a CLEAN washcloth.  Apply CHG soap ONLY FROM YOUR NECK DOWN TO YOUR TOES (washing for 3-5 minutes)  DO NOT use CHG soap on face, private areas, open wounds, or sores.  Pay special attention to the area where your surgery is being performed.  If you are having back surgery, having someone wash your back for you may be helpful. Wait 2 minutes after CHG soap is applied, then you may rinse off the CHG soap.  Pat dry with a clean towel  Put on clean pajamas    Additional instructions for the day of surgery: DO NOT APPLY any lotions,  powder,  oils,  deodorants (may use underarm deodorant) , cologne/  perfumes or makeup Do not wear jewelry / piercing's/ metal/  permanent jewelry must be removed prior to arrival day of surgery. Do not wear nail polish, gel polish, artificial nails, or any other type of covering on natural finger nails (toe nails are okay) Do not bring valuables to the hospital. [facility] is not responsible for valuables/personal belongings. Put on clean/comfortable clothes.  Please brush your teeth.  Ask your nurse before applying any prescription medications to the skin.

## 2024-06-06 ENCOUNTER — Encounter: Payer: Self-pay | Admitting: Family Medicine

## 2024-06-06 ENCOUNTER — Other Ambulatory Visit: Payer: Self-pay

## 2024-06-06 ENCOUNTER — Ambulatory Visit (INDEPENDENT_AMBULATORY_CARE_PROVIDER_SITE_OTHER): Admitting: Family Medicine

## 2024-06-06 VITALS — BP 112/74 | HR 98 | Ht 66.0 in | Wt 198.0 lb

## 2024-06-06 DIAGNOSIS — Z0000 Encounter for general adult medical examination without abnormal findings: Secondary | ICD-10-CM | POA: Diagnosis not present

## 2024-06-06 DIAGNOSIS — J4540 Moderate persistent asthma, uncomplicated: Secondary | ICD-10-CM

## 2024-06-06 MED ORDER — ALBUTEROL SULFATE HFA 108 (90 BASE) MCG/ACT IN AERS
2.0000 | INHALATION_SPRAY | RESPIRATORY_TRACT | 2 refills | Status: AC | PRN
  Filled 2024-06-06: qty 20.1, 75d supply, fill #0
  Filled 2024-09-07: qty 20.1, 75d supply, fill #1

## 2024-06-06 MED ORDER — FLUTICASONE FUROATE-VILANTEROL 100-25 MCG/ACT IN AEPB
1.0000 | INHALATION_SPRAY | Freq: Every day | RESPIRATORY_TRACT | 3 refills | Status: DC
  Filled 2024-06-06: qty 60, 30d supply, fill #0
  Filled 2024-06-07 – 2024-06-16 (×2): qty 60, 60d supply, fill #0

## 2024-06-06 NOTE — Patient Instructions (Signed)
 It was wonderful to see you today!  Today was your annual wellness exam.  We went over your worsening asthma symptoms and started you on a new inhaler.  This will be a daily inhaler you use even when you are not having symptoms.  You may continue to use your albuterol  as a rescue inhaler when you have wheezing or shortness of breath.  I would like to see you back in 1 month to follow-up on how you are doing with this inhaler.  I will also refer you to Dr. Amalia our pharmacy specialist to discuss updated PFTs.  Otherwise you are up-to-date on all of your current screening measures, and we will follow-up as needed for your chronic conditions.  Please call [phone] with any questions about today's appointment.   If you need any additional refills, please call your pharmacy before calling the office.  [name], DO Family Medicine

## 2024-06-06 NOTE — Progress Notes (Signed)
    SUBJECTIVE:   Chief compliant/HPI: annual examination  Brooke [name] is a 48 y.o. who presents today for an annual exam.   History tabs reviewed and updated.   -Asthma- using inhaler 3-4 times a day, especially when ambulating. Currently her asthma is classified as mild intermittent, however she is having inhaler use daily and awakening 3-4 times per night with shortness of breath which would increase her to the moderate persistent category.  He has not previously been on a long-term asthma controlling medication.  -submucosal fibroids-patient to undergo total hysterectomy with GYN surgery  Review of systems form reviewed and notable for increased wheezing, SOB.   OBJECTIVE:   Ht 5' 6 (1.676 m)   Wt 198 lb (89.8 kg)   LMP 05/27/2024 (Exact Date)   BMI 31.96 kg/m   General: A&O, NAD HEENT: No sign of trauma, EOM grossly intact Cardiac: RRR, no m/r/g Respiratory: CTAB, normal WOB, no w/c/r.  Mildly prolonged expiratory phase. GI: Soft, NTTP, non-distended  Extremities: NTTP, no peripheral edema. Neuro: Normal gait, moves all four extremities appropriately. Psych: Appropriate mood and affect   ASSESSMENT/PLAN:   Assessment & Plan Moderate persistent asthma without complication - Continue albuterol  inhaler for rescue -Begin therapy with Breo ellipta  daily -Will refer to Brooke [name] for updated PFTs -Follow-up with me in 1 month to evaluate efficacy of new therapy Encounter for annual general medical examination without abnormal findings in adult - See results below Annual Examination  See AVS for age appropriate recommendations.   PHQ score 7, reviewed and discussed.  Blood pressure reviewed and at goal.  Asked about intimate partner violence and resources given as appropriate  The patient currently uses bilateral tubal ligation for contraception. Patient has upcoming hysterectomy  Considered the following items based upon USPSTF recommendations: Diabetes screening:  ordered Screening for elevated cholesterol: ordered HIV testing: not indicated  Hepatitis C: not indicated Hepatitis B: not indicated Reviewed risk factors for latent tuberculosis and not indicated  Discussed family history, BRCA testing not indicated. Tool used to risk stratify was patient history.  Cervical cancer screening: prior Pap reviewed, repeat due in 2030 Breast cancer screening: discussed potential benefits, risks including overdiagnosis and biopsy, elected proceed with mammogram Colorectal cancer screening: discussed options, elected for cologuard if age 24 or over.   Follow up in 1 year or sooner if indicated.  MyChart Activation: Already signed up  Brooke Pinal, DO [facility]

## 2024-06-07 ENCOUNTER — Other Ambulatory Visit (HOSPITAL_COMMUNITY): Payer: Self-pay

## 2024-06-07 ENCOUNTER — Other Ambulatory Visit: Payer: Self-pay

## 2024-06-07 ENCOUNTER — Ambulatory Visit: Payer: Self-pay | Admitting: Family Medicine

## 2024-06-07 LAB — BASIC METABOLIC PANEL WITH GFR
BUN/Creatinine Ratio: 19 (ref 9–23)
BUN: 18 mg/dL (ref 6–24)
CO2: 19 mmol/L — ABNORMAL LOW (ref 20–29)
Calcium: 9.4 mg/dL (ref 8.7–10.2)
Chloride: 108 mmol/L — ABNORMAL HIGH (ref 96–106)
Creatinine, Ser: 0.94 mg/dL (ref 0.57–1.00)
Glucose: 91 mg/dL (ref 70–99)
Potassium: 4.9 mmol/L (ref 3.5–5.2)
Sodium: 138 mmol/L (ref 134–144)
eGFR: 76 mL/min/1.73 (ref >59)

## 2024-06-07 LAB — LIPID PANEL
Chol/HDL Ratio: 2.2 ratio (ref 0.0–4.4)
Cholesterol, Total: 195 mg/dL (ref 100–199)
HDL: 90 mg/dL (ref >39)
LDL Chol Calc (NIH): 93 mg/dL (ref 0–99)
Triglycerides: 64 mg/dL (ref 0–149)
VLDL Cholesterol Cal: 12 mg/dL (ref 5–40)

## 2024-06-09 ENCOUNTER — Encounter (HOSPITAL_COMMUNITY)
Admission: RE | Admit: 2024-06-09 | Discharge: 2024-06-09 | Disposition: A | Discharge status: Home / Self Care | Source: Ambulatory Visit | Attending: Obstetrics & Gynecology | Admitting: Obstetrics & Gynecology

## 2024-06-09 ENCOUNTER — Encounter (HOSPITAL_BASED_OUTPATIENT_CLINIC_OR_DEPARTMENT_OTHER): Payer: Self-pay | Admitting: Radiology

## 2024-06-09 ENCOUNTER — Ambulatory Visit (HOSPITAL_BASED_OUTPATIENT_CLINIC_OR_DEPARTMENT_OTHER)
Admission: RE | Admit: 2024-06-09 | Discharge: 2024-06-09 | Disposition: A | Discharge status: Home / Self Care | Source: Ambulatory Visit | Attending: Obstetrics & Gynecology | Admitting: Obstetrics & Gynecology

## 2024-06-09 ENCOUNTER — Inpatient Hospital Stay (HOSPITAL_BASED_OUTPATIENT_CLINIC_OR_DEPARTMENT_OTHER): Admission: RE | Admit: 2024-06-09 | Source: Ambulatory Visit | Admitting: Radiology

## 2024-06-09 DIAGNOSIS — Z01812 Encounter for preprocedural laboratory examination: Secondary | ICD-10-CM | POA: Insufficient documentation

## 2024-06-09 DIAGNOSIS — Z1231 Encounter for screening mammogram for malignant neoplasm of breast: Secondary | ICD-10-CM | POA: Insufficient documentation

## 2024-06-09 DIAGNOSIS — N921 Excessive and frequent menstruation with irregular cycle: Secondary | ICD-10-CM | POA: Insufficient documentation

## 2024-06-09 DIAGNOSIS — Z01818 Encounter for other preprocedural examination: Secondary | ICD-10-CM

## 2024-06-09 LAB — TYPE AND SCREEN
ABO/RH(D): A POS
Antibody Screen: NEGATIVE

## 2024-06-09 LAB — CBC
HCT: 34.5 % — ABNORMAL LOW (ref 36.0–46.0)
Hemoglobin: 11.2 g/dL — ABNORMAL LOW (ref 12.0–15.0)
MCH: 27.9 pg (ref 26.0–34.0)
MCHC: 32.5 g/dL (ref 30.0–36.0)
MCV: 85.8 fL (ref 80.0–100.0)
Platelets: 225 K/uL (ref 150–400)
RBC: 4.02 MIL/uL (ref 3.87–5.11)
RDW: 13.1 % (ref 11.5–15.5)
WBC: 6.1 K/uL (ref 4.0–10.5)
nRBC: 0 % (ref 0.0–0.2)

## 2024-06-09 LAB — BASIC METABOLIC PANEL WITH GFR
Anion gap: 8 (ref 5–15)
BUN: 12 mg/dL (ref 6–20)
CO2: 21 mmol/L — ABNORMAL LOW (ref 22–32)
Calcium: 8.8 mg/dL — ABNORMAL LOW (ref 8.9–10.3)
Chloride: 110 mmol/L (ref 98–111)
Creatinine, Ser: 0.82 mg/dL (ref 0.44–1.00)
GFR, Estimated: >60 mL/min (ref >60)
Glucose, Bld: 97 mg/dL (ref 70–99)
Potassium: 3.8 mmol/L (ref 3.5–5.1)
Sodium: 139 mmol/L (ref 135–145)

## 2024-06-13 ENCOUNTER — Ambulatory Visit (HOSPITAL_BASED_OUTPATIENT_CLINIC_OR_DEPARTMENT_OTHER): Payer: Self-pay

## 2024-06-13 ENCOUNTER — Ambulatory Visit (HOSPITAL_COMMUNITY)
Admission: RE | Admit: 2024-06-13 | Discharge: 2024-06-14 | Disposition: A | Discharge status: Home / Self Care | Attending: Obstetrics & Gynecology | Admitting: Obstetrics & Gynecology

## 2024-06-13 ENCOUNTER — Encounter (HOSPITAL_COMMUNITY): Payer: Self-pay | Admitting: Obstetrics & Gynecology

## 2024-06-13 ENCOUNTER — Ambulatory Visit (HOSPITAL_COMMUNITY): Payer: Self-pay

## 2024-06-13 ENCOUNTER — Other Ambulatory Visit (HOSPITAL_BASED_OUTPATIENT_CLINIC_OR_DEPARTMENT_OTHER): Payer: Self-pay | Admitting: Obstetrics & Gynecology

## 2024-06-13 ENCOUNTER — Other Ambulatory Visit (HOSPITAL_COMMUNITY): Payer: Self-pay

## 2024-06-13 ENCOUNTER — Other Ambulatory Visit: Payer: Self-pay

## 2024-06-13 ENCOUNTER — Encounter (HOSPITAL_COMMUNITY)
Admission: RE | Disposition: A | Discharge status: Home / Self Care | Payer: Self-pay | Source: Home / Self Care | Attending: Obstetrics & Gynecology

## 2024-06-13 DIAGNOSIS — N921 Excessive and frequent menstruation with irregular cycle: Secondary | ICD-10-CM | POA: Insufficient documentation

## 2024-06-13 DIAGNOSIS — D250 Submucous leiomyoma of uterus: Secondary | ICD-10-CM | POA: Diagnosis not present

## 2024-06-13 DIAGNOSIS — N8003 Adenomyosis of the uterus: Secondary | ICD-10-CM

## 2024-06-13 DIAGNOSIS — J4520 Mild intermittent asthma, uncomplicated: Secondary | ICD-10-CM | POA: Insufficient documentation

## 2024-06-13 DIAGNOSIS — N920 Excessive and frequent menstruation with regular cycle: Secondary | ICD-10-CM | POA: Diagnosis present

## 2024-06-13 DIAGNOSIS — Z01818 Encounter for other preprocedural examination: Secondary | ICD-10-CM

## 2024-06-13 DIAGNOSIS — Z7951 Long term (current) use of inhaled steroids: Secondary | ICD-10-CM | POA: Diagnosis not present

## 2024-06-13 DIAGNOSIS — R928 Other abnormal and inconclusive findings on diagnostic imaging of breast: Secondary | ICD-10-CM

## 2024-06-13 DIAGNOSIS — Z79899 Other long term (current) drug therapy: Secondary | ICD-10-CM | POA: Insufficient documentation

## 2024-06-13 DIAGNOSIS — D649 Anemia, unspecified: Secondary | ICD-10-CM | POA: Diagnosis not present

## 2024-06-13 DIAGNOSIS — D259 Leiomyoma of uterus, unspecified: Secondary | ICD-10-CM | POA: Diagnosis not present

## 2024-06-13 DIAGNOSIS — N938 Other specified abnormal uterine and vaginal bleeding: Secondary | ICD-10-CM | POA: Insufficient documentation

## 2024-06-13 DIAGNOSIS — D500 Iron deficiency anemia secondary to blood loss (chronic): Secondary | ICD-10-CM

## 2024-06-13 HISTORY — DX: Abnormal uterine and vaginal bleeding, unspecified: N93.9

## 2024-06-13 HISTORY — DX: Personal history of diseases of the blood and blood-forming organs and certain disorders involving the immune mechanism: Z86.2

## 2024-06-13 HISTORY — DX: Leiomyoma of uterus, unspecified: D25.9

## 2024-06-13 HISTORY — DX: Excessive and frequent menstruation with irregular cycle: N92.1

## 2024-06-13 HISTORY — DX: Other seasonal allergic rhinitis: J30.2

## 2024-06-13 HISTORY — DX: Mild intermittent asthma, uncomplicated: J45.20

## 2024-06-13 LAB — HEMOGLOBIN: Hemoglobin: 10.5 g/dL — ABNORMAL LOW (ref 12.0–15.0)

## 2024-06-13 LAB — POCT PREGNANCY, URINE: Preg Test, Ur: NEGATIVE

## 2024-06-13 LAB — ABO/RH: ABO/RH(D): A POS

## 2024-06-13 SURGERY — HYSTERECTOMY, TOTAL, LAPAROSCOPIC, WITH SALPINGECTOMY
Anesthesia: General

## 2024-06-13 MED ORDER — FENTANYL CITRATE (PF) 100 MCG/2ML IJ SOLN
INTRAMUSCULAR | Status: AC
  Filled 2024-06-13: qty 2

## 2024-06-13 MED ORDER — SUGAMMADEX SODIUM 200 MG/2ML IV SOLN
INTRAVENOUS | Status: DC | PRN
  Administered 2024-06-13: 200 mg via INTRAVENOUS

## 2024-06-13 MED ORDER — PROPOFOL 10 MG/ML IV BOLUS
INTRAVENOUS | Status: AC
  Filled 2024-06-13: qty 20

## 2024-06-13 MED ORDER — ONDANSETRON HCL 4 MG/2ML IJ SOLN
INTRAMUSCULAR | Status: DC | PRN
  Administered 2024-06-13: 4 mg via INTRAVENOUS

## 2024-06-13 MED ORDER — DIPHENHYDRAMINE HCL 50 MG/ML IJ SOLN
INTRAMUSCULAR | Status: DC | PRN
  Administered 2024-06-13: 12.5 mg via INTRAVENOUS

## 2024-06-13 MED ORDER — PHENYLEPHRINE HCL-NACL 20-0.9 MG/250ML-% IV SOLN
INTRAVENOUS | Status: DC | PRN
  Administered 2024-06-13: 10 ug/min via INTRAVENOUS

## 2024-06-13 MED ORDER — LIDOCAINE 2% (20 MG/ML) 5 ML SYRINGE
INTRAMUSCULAR | Status: DC | PRN
  Administered 2024-06-13: 100 mg via INTRAVENOUS

## 2024-06-13 MED ORDER — GABAPENTIN 300 MG PO CAPS
ORAL_CAPSULE | ORAL | Status: AC
  Filled 2024-06-13: qty 1

## 2024-06-13 MED ORDER — METRONIDAZOLE 500 MG/100ML IV SOLN
INTRAVENOUS | Status: AC
  Filled 2024-06-13: qty 100

## 2024-06-13 MED ORDER — AMISULPRIDE (ANTIEMETIC) 5 MG/2ML IV SOLN: 10.0000 mg | Freq: Once | INTRAVENOUS | Status: DC | PRN

## 2024-06-13 MED ORDER — FENTANYL CITRATE (PF) 100 MCG/2ML IJ SOLN
INTRAMUSCULAR | Status: AC
Start: 2024-06-13 — End: 2024-06-13
  Filled 2024-06-13: qty 2

## 2024-06-13 MED ORDER — ROPIVACAINE HCL 5 MG/ML IJ SOLN
INTRAMUSCULAR | Status: AC
  Filled 2024-06-13: qty 30

## 2024-06-13 MED ORDER — HEMOSTATIC AGENTS (NO CHARGE) OPTIME
TOPICAL | Status: DC | PRN
  Administered 2024-06-13: 1 via TOPICAL

## 2024-06-13 MED ORDER — METRONIDAZOLE 500 MG/100ML IV SOLN
500.0000 mg | INTRAVENOUS | Status: AC
  Administered 2024-06-13: 500 mg via INTRAVENOUS

## 2024-06-13 MED ORDER — DEXAMETHASONE SODIUM PHOSPHATE 10 MG/ML IJ SOLN
INTRAMUSCULAR | Status: DC | PRN
  Administered 2024-06-13: 8 mg via INTRAVENOUS

## 2024-06-13 MED ORDER — KETOROLAC TROMETHAMINE 30 MG/ML IJ SOLN
30.0000 mg | Freq: Four times a day (QID) | INTRAMUSCULAR | Status: AC
  Administered 2024-06-13 – 2024-06-14 (×4): 30 mg via INTRAVENOUS
  Filled 2024-06-13 (×4): qty 1

## 2024-06-13 MED ORDER — PANTOPRAZOLE SODIUM 40 MG IV SOLR
40.0000 mg | Freq: Every day | INTRAVENOUS | Status: DC
  Administered 2024-06-13: 40 mg via INTRAVENOUS
  Filled 2024-06-13: qty 10

## 2024-06-13 MED ORDER — ACETAMINOPHEN 500 MG PO TABS
1000.0000 mg | ORAL_TABLET | ORAL | Status: AC
  Administered 2024-06-13: 1000 mg via ORAL

## 2024-06-13 MED ORDER — IBUPROFEN 800 MG PO TABS
800.0000 mg | ORAL_TABLET | Freq: Three times a day (TID) | ORAL | 0 refills | Status: DC | PRN
Start: 2024-06-13 — End: 2024-07-12
  Filled 2024-06-13: qty 30, 10d supply, fill #0

## 2024-06-13 MED ORDER — GABAPENTIN 100 MG PO CAPS
100.0000 mg | ORAL_CAPSULE | Freq: Three times a day (TID) | ORAL | Status: DC
  Administered 2024-06-13: 100 mg via ORAL
  Filled 2024-06-13: qty 1

## 2024-06-13 MED ORDER — CHLORHEXIDINE GLUCONATE 0.12 % MT SOLN
OROMUCOSAL | Status: AC
  Filled 2024-06-13: qty 15

## 2024-06-13 MED ORDER — MENTHOL 3 MG MT LOZG
1.0000 | LOZENGE | OROMUCOSAL | Status: DC | PRN
  Administered 2024-06-13: 3 mg via ORAL
  Filled 2024-06-13: qty 9

## 2024-06-13 MED ORDER — SODIUM CHLORIDE (PF) 0.9 % IJ SOLN
INTRAMUSCULAR | Status: AC
  Filled 2024-06-13: qty 50

## 2024-06-13 MED ORDER — DEXTROSE-SODIUM CHLORIDE 5-0.45 % IV SOLN: INTRAVENOUS | Status: DC

## 2024-06-13 MED ORDER — ROCURONIUM BROMIDE 10 MG/ML (PF) SYRINGE
PREFILLED_SYRINGE | INTRAVENOUS | Status: AC
  Filled 2024-06-13: qty 10

## 2024-06-13 MED ORDER — LIDOCAINE 2% (20 MG/ML) 5 ML SYRINGE
INTRAMUSCULAR | Status: AC
  Filled 2024-06-13: qty 5

## 2024-06-13 MED ORDER — BUPIVACAINE HCL (PF) 0.25 % IJ SOLN
INTRAMUSCULAR | Status: AC
  Filled 2024-06-13: qty 60

## 2024-06-13 MED ORDER — MIDAZOLAM HCL 2 MG/2ML IJ SOLN
INTRAMUSCULAR | Status: DC | PRN
  Administered 2024-06-13: 2 mg via INTRAVENOUS

## 2024-06-13 MED ORDER — GABAPENTIN 100 MG PO CAPS
100.0000 mg | ORAL_CAPSULE | ORAL | Status: AC
  Administered 2024-06-13: 100 mg via ORAL

## 2024-06-13 MED ORDER — PROPOFOL 10 MG/ML IV BOLUS
INTRAVENOUS | Status: DC | PRN
  Administered 2024-06-13: 200 mg via INTRAVENOUS

## 2024-06-13 MED ORDER — OXYCODONE-ACETAMINOPHEN 5-325 MG PO TABS
1.0000 | ORAL_TABLET | ORAL | Status: DC | PRN
  Administered 2024-06-13 – 2024-06-14 (×4): 1 via ORAL
  Filled 2024-06-13 (×2): qty 2
  Filled 2024-06-13 (×2): qty 1

## 2024-06-13 MED ORDER — CHLORHEXIDINE GLUCONATE 0.12 % MT SOLN
15.0000 mL | Freq: Once | OROMUCOSAL | Status: AC
  Administered 2024-06-13: 15 mL via OROMUCOSAL

## 2024-06-13 MED ORDER — ALUM & MAG HYDROXIDE-SIMETH 200-200-20 MG/5ML PO SUSP: 30.0000 mL | ORAL | Status: DC | PRN

## 2024-06-13 MED ORDER — ONDANSETRON HCL 4 MG PO TABS: 4.0000 mg | ORAL_TABLET | Freq: Four times a day (QID) | ORAL | Status: DC | PRN

## 2024-06-13 MED ORDER — LACTATED RINGERS IV SOLN: INTRAVENOUS | Status: DC

## 2024-06-13 MED ORDER — POVIDONE-IODINE 10 % EX SWAB: 2.0000 | Freq: Once | CUTANEOUS | Status: DC

## 2024-06-13 MED ORDER — FENTANYL CITRATE (PF) 250 MCG/5ML IJ SOLN
INTRAMUSCULAR | Status: DC | PRN
  Administered 2024-06-13: 100 ug via INTRAVENOUS

## 2024-06-13 MED ORDER — ROCURONIUM BROMIDE 10 MG/ML (PF) SYRINGE
PREFILLED_SYRINGE | INTRAVENOUS | Status: DC | PRN
  Administered 2024-06-13 (×2): 20 mg via INTRAVENOUS
  Administered 2024-06-13: 50 mg via INTRAVENOUS

## 2024-06-13 MED ORDER — IBUPROFEN 600 MG PO TABS
600.0000 mg | ORAL_TABLET | Freq: Four times a day (QID) | ORAL | Status: DC
  Administered 2024-06-14: 600 mg via ORAL
  Filled 2024-06-13: qty 1

## 2024-06-13 MED ORDER — ORAL CARE MOUTH RINSE: 15.0000 mL | Freq: Once | OROMUCOSAL | Status: AC

## 2024-06-13 MED ORDER — HYDROMORPHONE HCL 1 MG/ML IJ SOLN
INTRAMUSCULAR | Status: AC
  Filled 2024-06-13: qty 0.5

## 2024-06-13 MED ORDER — HYDROMORPHONE HCL 1 MG/ML IJ SOLN
INTRAMUSCULAR | Status: DC | PRN
  Administered 2024-06-13: .5 mg via INTRAVENOUS

## 2024-06-13 MED ORDER — ONDANSETRON HCL 4 MG/2ML IJ SOLN: 4.0000 mg | Freq: Four times a day (QID) | INTRAMUSCULAR | Status: DC | PRN

## 2024-06-13 MED ORDER — STERILE WATER FOR IRRIGATION IR SOLN: Status: DC | PRN

## 2024-06-13 MED ORDER — SODIUM CHLORIDE 0.9 % IR SOLN
Status: DC | PRN
  Administered 2024-06-13: 1000 mL

## 2024-06-13 MED ORDER — ATROPINE SULFATE 0.4 MG/ML IV SOLN
INTRAVENOUS | Status: AC
  Filled 2024-06-13: qty 1

## 2024-06-13 MED ORDER — GENTAMICIN SULFATE 40 MG/ML IJ SOLN
5.0000 mg/kg | INTRAVENOUS | Status: AC
  Administered 2024-06-13: 437.6 mg via INTRAVENOUS
  Filled 2024-06-13: qty 11

## 2024-06-13 MED ORDER — ONDANSETRON HCL 4 MG/2ML IJ SOLN
INTRAMUSCULAR | Status: AC
  Filled 2024-06-13: qty 2

## 2024-06-13 MED ORDER — MORPHINE SULFATE (PF) 2 MG/ML IV SOLN: 1.0000 mg | INTRAVENOUS | Status: DC | PRN

## 2024-06-13 MED ORDER — SIMETHICONE 80 MG PO CHEW
80.0000 mg | CHEWABLE_TABLET | Freq: Four times a day (QID) | ORAL | Status: DC | PRN
  Administered 2024-06-13: 80 mg via ORAL
  Filled 2024-06-13: qty 1

## 2024-06-13 MED ORDER — FENTANYL CITRATE (PF) 100 MCG/2ML IJ SOLN
25.0000 ug | INTRAMUSCULAR | Status: DC | PRN
  Administered 2024-06-13 (×3): 50 ug via INTRAVENOUS

## 2024-06-13 MED ORDER — DEXAMETHASONE SODIUM PHOSPHATE 10 MG/ML IJ SOLN
INTRAMUSCULAR | Status: AC
  Filled 2024-06-13: qty 1

## 2024-06-13 MED ORDER — ACETAMINOPHEN 500 MG PO TABS
ORAL_TABLET | ORAL | Status: AC
  Filled 2024-06-13: qty 2

## 2024-06-13 MED ORDER — OXYCODONE-ACETAMINOPHEN 5-325 MG PO TABS
1.0000 | ORAL_TABLET | Freq: Four times a day (QID) | ORAL | 0 refills | Status: AC | PRN
  Filled 2024-06-13: qty 30, 5d supply, fill #0

## 2024-06-13 MED ORDER — BUPIVACAINE HCL (PF) 0.25 % IJ SOLN
INTRAMUSCULAR | Status: DC | PRN
  Administered 2024-06-13: 12 mL

## 2024-06-13 MED ORDER — GABAPENTIN 100 MG PO CAPS
ORAL_CAPSULE | ORAL | Status: AC
Start: 2024-06-13 — End: 2024-06-13
  Filled 2024-06-13: qty 1

## 2024-06-13 MED ORDER — MIDAZOLAM HCL 2 MG/2ML IJ SOLN
INTRAMUSCULAR | Status: AC
  Filled 2024-06-13: qty 2

## 2024-06-13 SURGICAL SUPPLY — 57 items
APPLICATOR ARISTA FLEXITIP XL (MISCELLANEOUS) IMPLANT
BAG COUNTER SPONGE SURGICOUNT (BAG) ×2 IMPLANT
COVER BACK TABLE 60X90IN (DRAPES) IMPLANT
COVER MAYO STAND STRL (DRAPES) ×2 IMPLANT
COVER SURGICAL LIGHT HANDLE (MISCELLANEOUS) ×2 IMPLANT
DERMABOND ADVANCED .7 DNX12 (GAUZE/BANDAGES/DRESSINGS) ×2 IMPLANT
DRAPE SURG IRRIG POUCH 19X23 (DRAPES) ×2 IMPLANT
DURAPREP 26ML APPLICATOR (WOUND CARE) ×2 IMPLANT
GAUZE 4X4 16PLY ~~LOC~~+RFID DBL (SPONGE) IMPLANT
GLOVE BIO SURGEON STRL SZ 6.5 (GLOVE) ×2 IMPLANT
GLOVE BIOGEL PI IND STRL 6.5 (GLOVE) ×2 IMPLANT
GLOVE BIOGEL PI IND STRL 7.0 (GLOVE) ×8 IMPLANT
GLOVE ECLIPSE 6.5 STRL STRAW (GLOVE) ×4 IMPLANT
GLOVE SURG UNDER POLY LF SZ7 (GLOVE) ×8 IMPLANT
GOWN STRL REUS W/ TWL LRG LVL3 (GOWN DISPOSABLE) ×8 IMPLANT
GOWN STRL REUS W/ TWL XL LVL3 (GOWN DISPOSABLE) ×2 IMPLANT
HEMOSTAT ARISTA ABSORB 3G PWDR (HEMOSTASIS) IMPLANT
HIBICLENS CHG 4% 4OZ BTL (MISCELLANEOUS) ×2 IMPLANT
IRRIGATION SUCT STRKRFLW 2 WTP (MISCELLANEOUS) ×2 IMPLANT
IV NS 1000ML BAXH (IV SOLUTION) IMPLANT
KIT PINK PAD W/HEAD ARM REST (MISCELLANEOUS) ×2 IMPLANT
KIT TURNOVER KIT B (KITS) ×2 IMPLANT
LIGASURE VESSEL 5MM BLUNT TIP (ELECTROSURGICAL) ×2 IMPLANT
NDL INSUFFLATION 14GA 120MM (NEEDLE) ×2 IMPLANT
NEEDLE INSUFFLATION 14GA 120MM (NEEDLE) ×2 IMPLANT
NS IRRIG 1000ML POUR BTL (IV SOLUTION) ×2 IMPLANT
OCCLUDER COLPOPNEUMO (BALLOONS) ×2 IMPLANT
PACK LAPAROSCOPY BASIN (CUSTOM PROCEDURE TRAY) ×2 IMPLANT
POUCH LAPAROSCOPIC INSTRUMENT (MISCELLANEOUS) ×2 IMPLANT
POWDER SURGICEL 3.0 GRAM (HEMOSTASIS) IMPLANT
SCISSORS LAP 5X35 DISP (ENDOMECHANICALS) ×2 IMPLANT
SET CYSTO W/LG BORE CLAMP LF (SET/KITS/TRAYS/PACK) ×2 IMPLANT
SET TRI-LUMEN FLTR TB AIRSEAL (TUBING) ×2 IMPLANT
SET TUBE SMOKE EVAC HIGH FLOW (TUBING) ×2 IMPLANT
SHEARS HARMONIC 36 ACE (MISCELLANEOUS) ×2 IMPLANT
SLEEVE ADV FIXATION 5X100MM (TROCAR) IMPLANT
SLEEVE SCD COMPRESS KNEE MED (STOCKING) ×2 IMPLANT
SUT VIC AB 0 CT1 27XBRD ANBCTR (SUTURE) ×4 IMPLANT
SUT VIC AB 4-0 PS2 18 (SUTURE) ×4 IMPLANT
SUT VICRYL 0 UR6 27IN ABS (SUTURE) IMPLANT
SUT VLOC 180 0 9IN GS21 (SUTURE) ×2 IMPLANT
SYR 10ML LL (SYRINGE) ×2 IMPLANT
SYR 30ML LL (SYRINGE) IMPLANT
SYR 50ML LL SCALE MARK (SYRINGE) ×4 IMPLANT
SYSTEM BAG RETRIEVAL 10MM (BASKET) IMPLANT
TIP ENDOSCOPIC SURGICEL (TIP) IMPLANT
TIP UTERINE 6.7X10CM GRN DISP (MISCELLANEOUS) IMPLANT
TIP UTERINE 6.7X8CM BLUE DISP (MISCELLANEOUS) IMPLANT
TOWEL GREEN STERILE FF (TOWEL DISPOSABLE) ×4 IMPLANT
TRAY FOLEY W/BAG SLVR 14FR (SET/KITS/TRAYS/PACK) ×2 IMPLANT
TROCAR 11X100 Z THREAD (TROCAR) IMPLANT
TROCAR ADV FIXATION 5X100MM (TROCAR) ×2 IMPLANT
TROCAR PORT AIRSEAL 5X120 (TROCAR) ×2 IMPLANT
TROCAR XCEL NON BLADE 8MM B8LT (ENDOMECHANICALS) ×2 IMPLANT
TROCAR XCEL NON-BLD 5MMX100MML (ENDOMECHANICALS) ×4 IMPLANT
UNDERPAD 30X36 HEAVY ABSORB (UNDERPADS AND DIAPERS) ×2 IMPLANT
WARMER LAPAROSCOPE (MISCELLANEOUS) ×2 IMPLANT

## 2024-06-13 NOTE — Anesthesia Procedure Notes (Cosign Needed Addendum)
 Procedure Name: Intubation Date/Time: 06/13/2024 7:37 AM  Performed by: Epifanio Charleston, MDPre-anesthesia Checklist: Patient identified, Emergency Drugs available, Suction available and Patient being monitored Patient Re-evaluated:Patient Re-evaluated prior to induction Oxygen Delivery Method: Circle system utilized Preoxygenation: Pre-oxygenation with 100% oxygen Induction Type: IV induction Ventilation: Mask ventilation without difficulty Laryngoscope Size: Mac and 3 Grade View: Grade I Tube type: Oral Tube size: 7.0 mm Number of attempts: 1 Airway Equipment and Method: Stylet and Oral airway Placement Confirmation: ETT inserted through vocal cords under direct vision, positive ETCO2 and breath sounds checked- equal and bilateral Secured at: 21 cm Tube secured with: Tape Dental Injury: Teeth and Oropharynx as per pre-operative assessment

## 2024-06-13 NOTE — Anesthesia Preprocedure Evaluation (Signed)
 Anesthesia Evaluation  Patient identified by MRN, date of birth, ID band Patient awake    Reviewed: Allergy & Precautions, NPO status , Patient's Chart, lab work & pertinent test results  Airway Mallampati: II  TM Distance: >3 FB Neck ROM: Full    Dental  (+) Dental Advisory Given   Pulmonary asthma    breath sounds clear to auscultation       Cardiovascular negative cardio ROS  Rhythm:Regular Rate:Normal     Neuro/Psych negative neurological ROS     GI/Hepatic negative GI ROS, Neg liver ROS,,,  Endo/Other  negative endocrine ROS    Renal/GU negative Renal ROS     Musculoskeletal   Abdominal   Peds  Hematology  (+) Blood dyscrasia, anemia   Anesthesia Other Findings   Reproductive/Obstetrics                              Anesthesia Physical Anesthesia Plan  ASA: 2  Anesthesia Plan: General   Post-op Pain Management: Tylenol  PO (pre-op)*, Gabapentin  PO (pre-op)* and Toradol  IV (intra-op)*   Induction: Intravenous  PONV Risk Score and Plan: 4 or greater and Ondansetron , Dexamethasone , Midazolam , Scopolamine patch - Pre-op and Treatment may vary due to age or medical condition  Airway Management Planned: Oral ETT  Additional Equipment:   Intra-op Plan:   Post-operative Plan: Extubation in OR  Informed Consent: I have reviewed the patients History and Physical, chart, labs and discussed the procedure including the risks, benefits and alternatives for the proposed anesthesia with the patient or authorized representative who has indicated his/her understanding and acceptance.     Dental advisory given  Plan Discussed with: CRNA  Anesthesia Plan Comments:         Anesthesia Quick Evaluation

## 2024-06-13 NOTE — Op Note (Signed)
 06/13/2024  10:24 AM  PATIENT:  Brooke [name]  48 y.o. female  PRE-OPERATIVE DIAGNOSIS:  Abnormal uterine bleeding Menorrhagia with irregular cycle Fibroids, submucosal  POST-OPERATIVE DIAGNOSIS:  Abnormal uterine bleedingMenorrhagia with irregular cycleFibroids, submucosal  PROCEDURE:  Procedure(s): HYSTERECTOMY, TOTAL, LAPAROSCOPIC, WITH SALPINGECTOMY CYSTOSCOPY  SURGEON:  Ronal GORMAN Pinal  ASSISTANTS: Dr. Buzz Carolin.  An experienced assistant was required given the standard of surgical care given the complexity of the case.  This assistant was needed for exposure, dissection, suctioning, retraction, instrument exchange and for overall help during the procedure.  RNFA help was also unavailable.  ANESTHESIA:   general  ESTIMATED BLOOD LOSS: 25 mL  BLOOD ADMINISTERED:none   FLUIDS: 700cc LR  UOP: 200cc clear UOP  SPECIMEN:  uterus, cervix, bilateral fallopian tubes  DISPOSITION OF SPECIMEN:  PATHOLOGY  FINDINGS: enlarged and bulky uterus, weight 213g at end of procedure, normal upper abdomen, normal ovaries, portions of tubes were removed with prior tubal ligation procedure  DESCRIPTION OF OPERATION: Patient is taken to the operating room. She is placed in the supine position. She is a running IV in place. Informed consent was present on the chart. SCDs on her lower extremities and functioning properly. Patient was positioned while she was awake.  Her legs were placed in the low lithotomy position in [place] stirrups. Her arms were tucked by the side.  General endotracheal anesthesia was administered by the anesthesia staff without difficulty. Dr. Myer Howell, anesthesia, oversaw case.  Time out performed.    Clora prep was then used to prep the abdomen and Hibiclens  was used to prep the inner thighs, perineum and vagina. Once 3 minutes had past the patient was draped in a normal standard fashion. The legs were lifted to the high lithotomy position. The cervix was visualized by  placing a heavy weighted speculum in the posterior aspect of the vagina and using a curved Deaver retractor to the retract anteriorly. The anterior lip of the cervix was grasped with single-tooth tenaculum.  The cervix sounded to 9 cm. Pratt dilators were used to dilate the cervix up to a #21. A RUMI uterine manipulator was obtained. A #8disposable tip was placed on the RUMI manipulator as well as a 3.5, silver KOH ring. This was passed through the cervix and the bulb of the disposable tip was inflated with 10 cc of normal saline. There was a good fit of the KOH ring around the cervix. The tenaculum was removed. There is also good manipulation of the uterus. The speculum and retractor were removed as well. A Foley catheter was placed to straight drain.  Clear urine was noted. Legs were lowered to the low lithotomy position and attention was turned the abdomen.  The umbilicus was everted.  Marcaine  0.25% used to anesthetize the skin.  Using #11 blade, 5mm skin incision was made.  A Veress needle was obtained. Syringe of sterile saline was placed on a open Veress needle.  With the abdomen elevated, the Veress needle was passed into the umbilicus until the pop was heard and then fluid started to drip.  Then low flow CO2 gas was attached the needle and the pneumoperitoneum was achieved without difficulty. Once four liters of gas was in the abdomen the Veress needle was removed and a 5 millimeter non-bladed Optiview trocar and port were passed directly to the abdomen. The laparoscope was then used to confirm intraperitoneal placement. Findings noted above.  Locations for RLQ, LLQ, and suprapubic ports were noted by transillumination of the abdominal  wall.  0.25% marcaine  was used to anesthetize the skin.  8mm skin incision was made in the RLQ and an AirSeal port was placed underdirect visualization of the laparoscope.  Then a 5mm skin incision was made and a 5mm nonbladed trochar and port was placed in the LLQ.   Finally, and 8mm skin incision was made about 4cm above the pubic symphasis and an 8mm non-bladed port was placed with direct visualization of the laparoscope.  All trochars were removed.    Ureters were identifies.  Attention was turned to the left side. With uterus on stretch the left tube was excised off the ovary and mesosalpinx was dissected to free the tube. Then the left utero-ovarian pedicle was serially clamped cauterized and incised using the ligasure device. Left round ligament was serially clamped cauterized and incised. The anterior and posterior peritoneum of the inferior leaf of the broad ligament were opened. The beginning of the bladder flap was created.  The bladder was taken down below the level of the KOH ring. The left uterine artery skeletonized and then just superior to the KOH ring this vessel was serially clamped, cauterized, and incised.  Attention was turned the right side.  The uterus was placed on stretch to the opposite side.  The tube was excised off the ovary using sharp dissection a bipolar cautery.  The mesosalpinx was incised freeing the tube. Then the right uterine ovarian pedicle was serially clamped cauterized and incised. Next the right round ligament was serially clamped cauterized and incised. The anterior posterior peritoneum of the inferiorly for the broad ligament were opened. The anterior peritoneum was carried across to the dissection on the left side. The remainder of the bladder flap was created using sharp dissection. The bladder was well below the level of the KOH ring. The right uterine artery skeletonized. Then the right uterine artery, above the level of the KOH ring, was serially clamped cauterized and incised. The uterus was devascularized at this point.  The colpotomy was performed a starting in the midline and using a harmonic scalpel with the inferior edge of the open blade  This was carried around a circumferential fashion until the vaginal mucosa was  completely incised in the specimen was freed.  The specimen was then delivered to the vagina.  A vaginal occlusive device was used to maintain the pneumoperitoneum  Instruments were changed with a needle driver and Kobra graspers.  Using a 9 inch V. lock suture, the cuff was closed by incorporating the anterior and posterior vaginal mucosa in each stitch. This was carried across all the way to the left corner and a running fashion. Two stitches were brought back towards the midline and the suture was cut flush with the vagina. The needle was brought out the pelvis. The pelvis was irrigated. All pedicles were inspected. No bleeding was noted.   Co2 pressures were lowered to 8mm Hg.  Again, no bleeding was noted.  Ureters were noted deep in the pelvis to be peristalsing.  Surgicel powder was placed along the pedicles.  This was left for 1 minute and then irrigated to remove any excess.  Irrigation was done two more times.  At this point the procedure was completed.  The remaining instruments were removed.  The ports (except the suprapubic port) were removed under direct visualization of the laparoscope and the pneumoperitoneum was relieved.  The patient was taken out of Trendelenburg positioning.  Several deep breaths were given to the patient's trying to any gas the  abdomen and finally the suprapubic port was removed.  The skin was then closed with subcuticular stitches of 3-0 Vicryl. The skin was cleansed Dermabond was applied. Attention was then turned the vagina and the cuff was inspected. No bleeding was noted. The anterior posterior vaginal mucosa was incorporated in each stitch. The Foley catheter was removed.  Cystoscopy was performed.  No sutures or bladder injuries were noted.  Ureters were noted with normal urine jets from each one was seen.  Foley was left out after the cystoscopic fluid was drained and cystoscope removed.  Sponge, lap, needle, instrument counts were correct x2. Patient tolerated the  procedure very well. She was awakened from anesthesia, extubated and taken to recovery in stable condition.   COUNTS:  YES  PLAN OF CARE: Transfer to PACU

## 2024-06-13 NOTE — H&P (Signed)
 Brooke [name] is an 48 y.o. female G54P3 AA female here for definitive treatment of menorrhagia.  Evaluation included ultrasound shwoing uterus measuring 11 x 6 x 5cm with 2.5cm endometrial mass c/w submucosal fibroids.  We initially discussed hysteroscopy removal but pt ultimately decided to proceed with definitive management.  She has also undergone an endometrial biopsy for evaluation.  She has used oral progesterone with modest improvement but really wants her bleeding to stop.  She has also dealt with anemia in the past.  Most recent hb is 11.2.  Procedure reviewed.  Questions answered.  Alternatives and risks have been discussed.  Pt is ready to proceed.    Pertinent Gynecological History: Menses: regular and heavy Contraception: tubal ligation DES exposure: denies Blood transfusions: none Sexually transmitted diseases: no past history Previous GYN Procedures: BTL  Last mammogram: normal Date: 06/09/2024 Last pap: normal Date: 04/12/2024 OB History: G3, P4   Menstrual History: Patient's last menstrual period was 05/27/2024 (exact date).    Past Medical History:  Diagnosis Date   Abnormal uterine bleeding (AUB)    History of iron deficiency anemia    Menorrhagia with irregular cycle    Mild intermittent asthma    Seasonal allergic rhinitis    Uterine fibroid     Past Surgical History:  Procedure Laterality Date   TUBAL LIGATION Bilateral 09/21/2000   @[facility]  by dr t. meisinger; epidural    Family History  Problem Relation Age of Onset   Hypertension Maternal Aunt    Diabetes Maternal Grandmother     Social History:  reports that she has never smoked. She has never been exposed to tobacco smoke. She has never used smokeless tobacco. She reports current alcohol use. She reports that she does not use drugs.  Allergies:  Allergies  Allergen Reactions   Penicillins Nausea Only and Other (See Comments)    dizziness    Medications Prior to Admission  Medication Sig Dispense  Refill Last Dose/Taking   albuterol  (VENTOLIN  HFA) 108 (90 Base) MCG/ACT inhaler Inhale 2-4 puffs into the lungs every 4 (four) hours as needed for wheezing (or cough). 20.1 g 2 06/13/2024 at  4:05 AM   cetirizine  (ZYRTEC ) 10 MG tablet Take 1 tablet (10 mg total) by mouth daily. 100 tablet 3 06/13/2024 at  5:05 AM   Collagen-Vitamin C-Biotin (COLLAGEN PO) Take 2 capsules by mouth daily.   Past Month   norethindrone  (MICRONOR ) 0.35 MG tablet Take 1 tablet (0.35 mg total) by mouth daily. (Patient taking differently: Take 1 tablet by mouth daily.) 84 tablet 11 Past Month   traZODone  (DESYREL ) 50 MG tablet Take 1/2-1 tablet (25-50 mg total) by mouth at bedtime as needed for sleep. (Patient taking differently: Take 25-50 mg by mouth at bedtime as needed for sleep.) 30 tablet 0 06/10/2024   acetaminophen  (TYLENOL ) 325 MG tablet Take 650 mg by mouth every 6 (six) hours as needed.   More than a month   acetic acid -hydrocortisone  (VOSOL -HC) OTIC solution Place 2 drops into the right ear at bedtime as needed. 10 mL 0 More than a month   fluticasone  (FLONASE ) 50 MCG/ACT nasal spray Place 2 sprays into both nostrils daily. (Patient not taking: Reported on 06/05/2024) 16 g 1 Unknown   fluticasone  furoate-vilanterol (BREO ELLIPTA ) 100-25 MCG/ACT AEPB Inhale 1 puff into the lungs daily. 60 each 3 Unknown   montelukast  (SINGULAIR ) 10 MG tablet Take 1 tablet (10 mg total) by mouth at bedtime. 90 tablet 1 More than a month  Review of Systems  Constitutional: Negative.   Genitourinary:        Heavy bleeding    Blood pressure (!) 143/91, pulse 93, temperature 98.6 F (37 C), temperature source Oral, resp. rate 16, height 5' 6 (1.676 m), weight 88 kg, last menstrual period 05/27/2024, SpO2 97%. Physical Exam Constitutional:      Appearance: Normal appearance.  Cardiovascular:     Rate and Rhythm: Normal rate and regular rhythm.  Pulmonary:     Effort: Pulmonary effort is normal.     Breath sounds: Normal breath  sounds.  Skin:    General: Skin is warm.  Neurological:     General: No focal deficit present.     Mental Status: She is alert.  Psychiatric:        Mood and Affect: Mood normal.        Behavior: Behavior normal.     Results for orders placed or performed during the hospital encounter of 06/13/24 (from the past 24 hours)  Pregnancy, urine POC     Status: None   Collection Time: 06/13/24  6:19 AM  Result Value Ref Range   Preg Test, Ur NEGATIVE NEGATIVE  ABO/Rh     Status: None (Preliminary result)   Collection Time: 06/13/24  6:22 AM  Result Value Ref Range   ABO/RH(D) PENDING     No results found.  Assessment/Plan: 48 yo G3P3 AA female with menorrhagia, submucosal fibroid, h/o anemia here for definitive treatment with TLH, bilateral salpingectomy, cystoscopy, possible oophorectomy. Questions answered.  Pt ready to proceed.  Ronal GORMAN Pinal 06/13/2024, 7:14 AM

## 2024-06-13 NOTE — Transfer of Care (Signed)
 Immediate Anesthesia Transfer of Care Note  Patient: Brooke [name]  Procedure(s) Performed: HYSTERECTOMY, TOTAL, LAPAROSCOPIC, WITH SALPINGECTOMY (Bilateral) CYSTOSCOPY  Patient Location: PACU  Anesthesia Type:General  Level of Consciousness: awake and drowsy  Airway & Oxygen Therapy: Patient Spontanous Breathing and Patient connected to face mask oxygen  Post-op Assessment: Report given to RN and Post -op Vital signs reviewed and stable  Post vital signs: Reviewed and stable  Last Vitals:  Vitals Value Taken Time  BP 144/87 06/13/24 10:00  Temp    Pulse 83 06/13/24 10:02  Resp 16 06/13/24 10:02  SpO2 100 % 06/13/24 10:02  Vitals shown include unfiled device data.  Last Pain:  Vitals:   06/13/24 0605  TempSrc: Oral  PainSc: 0-No pain      Patients Stated Pain Goal: 5 (06/13/24 9394)  Complications: There were no known notable events for this encounter.

## 2024-06-13 NOTE — Progress Notes (Signed)
*   Day of Surgery * Procedure(s) (LRB): HYSTERECTOMY, TOTAL, LAPAROSCOPIC, WITH SALPINGECTOMY (Bilateral) CYSTOSCOPY (N/A)  Subjective: Patient reports some pain initially after coming out of the operating room but that is much improved.  She did have some nausea but that has improved.  Has tolerated clear liquids.  Hasn't voided yet.  No vaginal bleeding.  Objective: I have reviewed patient's vital signs, intake and output, and medications. Vitals:   06/13/24 1152 06/13/24 1254  BP: 133/88 113/67  Pulse: 78 75  Resp: 14 13  Temp: 98.1 F (36.7 C) 97.9 F (36.6 C)  SpO2: 100% 100%    General: alert and no distress Resp: clear to auscultation bilaterally Cardio: regular GI: incision: clean, dry, and intact and soft, appropriately tender Extremities: extremities normal, atraumatic, no cyanosis or edema and SCDs in placed Vaginal Bleeding: none  Assessment: s/p Procedure(s) with comments: HYSTERECTOMY, TOTAL, LAPAROSCOPIC, WITH SALPINGECTOMY (Bilateral) - TLH/Bilateral salpingectomy/cystoscopy, possible oophorectomy CYSTOSCOPY (N/A): stable and progressing well  Plan: Advance diet Encourage ambulation Voiding trials Hb will be drawn this afternoon Continue SCDs   LOS: 0 days    Ronal GORMAN Pinal, MD 06/13/2024, 1:50 PM

## 2024-06-13 NOTE — Anesthesia Postprocedure Evaluation (Signed)
 Anesthesia Post Note  Patient: Brooke [name]  Procedure(s) Performed: HYSTERECTOMY, TOTAL, LAPAROSCOPIC, WITH SALPINGECTOMY (Bilateral) CYSTOSCOPY     Patient location during evaluation: PACU Anesthesia Type: General Level of consciousness: awake and alert Pain management: pain level controlled Vital Signs Assessment: post-procedure vital signs reviewed and stable Respiratory status: spontaneous breathing, nonlabored ventilation, respiratory function stable and patient connected to nasal cannula oxygen Cardiovascular status: blood pressure returned to baseline and stable Postop Assessment: no apparent nausea or vomiting Anesthetic complications: no   There were no known notable events for this encounter.  Last Vitals:  Vitals:   06/13/24 1152 06/13/24 1254  BP: 133/88 113/67  Pulse: 78 75  Resp: 14 13  Temp: 36.7 C 36.6 C  SpO2: 100% 100%    Last Pain:  Vitals:   06/13/24 1254  TempSrc: Axillary  PainSc:                  Epifanio Lamar BRAVO

## 2024-06-13 NOTE — Progress Notes (Signed)
 Family member was updated.

## 2024-06-14 ENCOUNTER — Encounter (HOSPITAL_COMMUNITY): Payer: Self-pay | Admitting: Obstetrics & Gynecology

## 2024-06-14 ENCOUNTER — Other Ambulatory Visit (HOSPITAL_COMMUNITY): Payer: Self-pay

## 2024-06-14 DIAGNOSIS — D649 Anemia, unspecified: Secondary | ICD-10-CM | POA: Diagnosis not present

## 2024-06-14 DIAGNOSIS — N938 Other specified abnormal uterine and vaginal bleeding: Secondary | ICD-10-CM | POA: Diagnosis not present

## 2024-06-14 DIAGNOSIS — D250 Submucous leiomyoma of uterus: Secondary | ICD-10-CM | POA: Diagnosis not present

## 2024-06-14 DIAGNOSIS — N921 Excessive and frequent menstruation with irregular cycle: Secondary | ICD-10-CM | POA: Diagnosis not present

## 2024-06-14 DIAGNOSIS — Z79899 Other long term (current) drug therapy: Secondary | ICD-10-CM | POA: Diagnosis not present

## 2024-06-14 DIAGNOSIS — N8003 Adenomyosis of the uterus: Secondary | ICD-10-CM | POA: Diagnosis not present

## 2024-06-14 DIAGNOSIS — J4520 Mild intermittent asthma, uncomplicated: Secondary | ICD-10-CM | POA: Diagnosis not present

## 2024-06-14 DIAGNOSIS — Z7951 Long term (current) use of inhaled steroids: Secondary | ICD-10-CM | POA: Diagnosis not present

## 2024-06-14 LAB — SURGICAL PATHOLOGY

## 2024-06-14 NOTE — Progress Notes (Signed)
 1 Day Post-Op Procedure(s) (LRB): HYSTERECTOMY, TOTAL, LAPAROSCOPIC, WITH SALPINGECTOMY (Bilateral) CYSTOSCOPY (N/A)  Subjective: Patient reports she is having some pain but medication is working.  Dizziness has resolved . Voiding well.  Walking without difficulty.  Had tolerated regular diet.  Passing flatus.  No vaginal bleeding.  Objective: I have reviewed patient's vital signs, intake and output, medications, and labs. Vitals:   06/14/24 0344 06/14/24 0743  BP: 124/68 129/68  Pulse: 89 (!) 105  Resp: 16 16  Temp: 98.3 F (36.8 C) 98.3 F (36.8 C)  SpO2: 100% 100%     General: cooperative and no distress Resp: clear to auscultation bilaterally Cardio: regular rate and rhythm, S1, S2 normal, no murmur, click, rub or gallop GI: soft, non-tender; bowel sounds normal; no masses,  no organomegaly and incision: clean, dry, and intact Extremities: extremities normal, atraumatic, no cyanosis or edema Vaginal Bleeding: none  Assessment: s/p Procedure(s) with comments: HYSTERECTOMY, TOTAL, LAPAROSCOPIC, WITH SALPINGECTOMY (Bilateral) - TLH/Bilateral salpingectomy/cystoscopy, possible oophorectomy CYSTOSCOPY (N/A): progressing well  Plan: Discharge home  LOS: 0 days    Ronal GORMAN Pinal, MD 06/14/2024, 9:46 AM

## 2024-06-14 NOTE — Discharge Instructions (Addendum)

## 2024-06-15 ENCOUNTER — Ambulatory Visit: Payer: Self-pay | Admitting: Family Medicine

## 2024-06-15 ENCOUNTER — Other Ambulatory Visit: Payer: Self-pay | Admitting: Obstetrics & Gynecology

## 2024-06-15 ENCOUNTER — Encounter (HOSPITAL_BASED_OUTPATIENT_CLINIC_OR_DEPARTMENT_OTHER): Payer: Self-pay | Admitting: Obstetrics & Gynecology

## 2024-06-15 DIAGNOSIS — R928 Other abnormal and inconclusive findings on diagnostic imaging of breast: Secondary | ICD-10-CM

## 2024-06-16 ENCOUNTER — Encounter: Payer: Self-pay | Admitting: Family Medicine

## 2024-06-16 ENCOUNTER — Other Ambulatory Visit (HOSPITAL_COMMUNITY): Payer: Self-pay

## 2024-06-20 ENCOUNTER — Other Ambulatory Visit (HOSPITAL_COMMUNITY): Payer: Self-pay

## 2024-06-22 ENCOUNTER — Encounter: Payer: Self-pay | Admitting: Pharmacist

## 2024-06-26 ENCOUNTER — Telehealth: Payer: Self-pay | Admitting: Pharmacist

## 2024-06-26 ENCOUNTER — Other Ambulatory Visit (HOSPITAL_COMMUNITY): Payer: Self-pay

## 2024-06-26 MED ORDER — BUDESONIDE-FORMOTEROL FUMARATE 160-4.5 MCG/ACT IN AERO
2.0000 | INHALATION_SPRAY | Freq: Two times a day (BID) | RESPIRATORY_TRACT | 6 refills | Status: AC
  Filled 2024-06-26: qty 10.2, 30d supply, fill #0
  Filled 2024-09-07: qty 10.2, 30d supply, fill #1

## 2024-06-26 NOTE — Telephone Encounter (Signed)
 Attempted to contact patient for follow-up of  question on inhaler - BREO cost prohibitive vs. Dose increase of Symbicort .    Shared the higher dose of Symbicort  is $0 copay.   Advised that I would send to [facility] pharmacy for pick-up.  Left voice mail requesting call back to direct phone: [phone]  Total time with patient call and documentation of interaction: 12 minutes.  Follow-up phone call planned: none

## 2024-06-26 NOTE — Telephone Encounter (Signed)
 Reviewed and agree with Dr Rennis plan.

## 2024-06-27 ENCOUNTER — Encounter (HOSPITAL_BASED_OUTPATIENT_CLINIC_OR_DEPARTMENT_OTHER): Payer: Self-pay | Admitting: Obstetrics & Gynecology

## 2024-06-29 ENCOUNTER — Telehealth: Admitting: Physician Assistant

## 2024-06-29 DIAGNOSIS — R3989 Other symptoms and signs involving the genitourinary system: Secondary | ICD-10-CM | POA: Diagnosis not present

## 2024-06-29 MED ORDER — SULFAMETHOXAZOLE-TRIMETHOPRIM 800-160 MG PO TABS
1.0000 | ORAL_TABLET | Freq: Two times a day (BID) | ORAL | 0 refills | Status: DC
Start: 2024-06-29 — End: 2024-07-12

## 2024-06-29 NOTE — Progress Notes (Signed)
 I have spent 5 minutes in review of e-visit questionnaire, review and updating patient chart, medical decision making and response to patient.   Elsie Velma Lunger, PA-C

## 2024-06-29 NOTE — Progress Notes (Signed)

## 2024-07-04 ENCOUNTER — Ambulatory Visit: Payer: Self-pay | Admitting: Family Medicine

## 2024-07-12 ENCOUNTER — Ambulatory Visit (INDEPENDENT_AMBULATORY_CARE_PROVIDER_SITE_OTHER): Payer: Self-pay | Admitting: Obstetrics & Gynecology

## 2024-07-12 ENCOUNTER — Encounter (HOSPITAL_BASED_OUTPATIENT_CLINIC_OR_DEPARTMENT_OTHER): Payer: Self-pay | Admitting: Obstetrics & Gynecology

## 2024-07-12 ENCOUNTER — Other Ambulatory Visit (HOSPITAL_BASED_OUTPATIENT_CLINIC_OR_DEPARTMENT_OTHER): Payer: Self-pay

## 2024-07-12 VITALS — BP 103/86 | HR 77 | Wt 198.4 lb

## 2024-07-12 DIAGNOSIS — R5382 Chronic fatigue, unspecified: Secondary | ICD-10-CM | POA: Diagnosis not present

## 2024-07-12 DIAGNOSIS — Z9889 Other specified postprocedural states: Secondary | ICD-10-CM

## 2024-07-12 MED ORDER — IBUPROFEN 800 MG PO TABS
800.0000 mg | ORAL_TABLET | Freq: Three times a day (TID) | ORAL | 0 refills | Status: AC | PRN
  Filled 2024-07-12: qty 30, 10d supply, fill #0

## 2024-07-12 NOTE — Progress Notes (Signed)
 GYNECOLOGY  VISIT  CC:   post op recheck  HPI: 48 y.o. G55P3003 Married [name] here for recheck after undergoing TLH/bilateral salpingectomy/cystoscop on 06/13/2024.  She reports bleeding is minimal.  She has some umbilical pain that is still persistent.  Bowel function is Normal.  Bladder function is normal but she had a virtual visit with PCP on 8/21.  Was treated for a UTI but no culture was done.     MEDS:   Current Outpatient Medications on File Prior to Visit  Medication Sig Dispense Refill   acetaminophen  (TYLENOL ) 325 MG tablet Take 650 mg by mouth every 6 (six) hours as needed.     acetic acid -hydrocortisone  (VOSOL -HC) OTIC solution Place 2 drops into the right ear at bedtime as needed. 10 mL 0   albuterol  (VENTOLIN  HFA) 108 (90 Base) MCG/ACT inhaler Inhale 2-4 puffs into the lungs every 4 (four) hours as needed for wheezing (or cough). 20.1 g 2   budesonide -formoterol  (SYMBICORT ) 160-4.5 MCG/ACT inhaler Inhale 2 puffs into the lungs 2 (two) times daily. 10.2 g 6   cetirizine  (ZYRTEC ) 10 MG tablet Take 1 tablet (10 mg total) by mouth daily. 100 tablet 3   ibuprofen  (ADVIL ) 800 MG tablet Take 1 tablet (800 mg total) by mouth every 8 (eight) hours as needed. 30 tablet 0   traZODone  (DESYREL ) 50 MG tablet Take 1/2-1 tablet (25-50 mg total) by mouth at bedtime as needed for sleep. (Patient taking differently: Take 25-50 mg by mouth at bedtime as needed for sleep.) 30 tablet 0   Collagen-Vitamin C-Biotin (COLLAGEN PO) Take 2 capsules by mouth daily. (Patient not taking: Reported on 07/12/2024)     fluticasone  (FLONASE ) 50 MCG/ACT nasal spray Place 2 sprays into both nostrils daily. (Patient not taking: Reported on 07/12/2024) 16 g 1   montelukast  (SINGULAIR ) 10 MG tablet Take 1 tablet (10 mg total) by mouth at bedtime. (Patient not taking: Reported on 07/12/2024) 90 tablet 1   sulfamethoxazole -trimethoprim  (BACTRIM  DS) 800-160 MG tablet Take 1 tablet by mouth 2 (two) times  daily. (Patient not taking: Reported on 07/12/2024) 10 tablet 0   No current facility-administered medications on file prior to visit.    SH:  Smoking: No    PHYSICAL EXAMINATION:    BP 103/86 (BP Location: Left Arm, Patient Position: Sitting, Cuff Size: Normal)   Pulse 77   Wt 198 lb 6.4 oz (90 kg)   LMP 05/27/2024 (Exact Date)   SpO2 100%   BMI 32.02 kg/m     General appearance: alert, cooperative and appears stated age Abdomen: soft, non-tender; bowel sounds normal; no masses,  no organomegaly Incisions:  C/D/I, tenderness at umbilicus but no nodularity, hernia, erythema, mass noted  Pelvic: External genitalia:  no lesions              Urethra:  normal appearing urethra with no masses, tenderness or lesions              Bartholins and Skenes: normal                 Vagina: normal appearing vagina with normal color and discharge, no lesions, cuff well approximated and healing well              Cervix: absent              Bimanual Exam:  Uterus:  uterus absent              Adnexa: no mass, fullness,  tenderness  Assessment/Plan: 1. Post-operative state (Primary) - recheck 4 weeks - note to extend [facility] letter written for pt  2. Chronic fatigue - CBC with Differential/Platelet - TSH - VITAMIN D  25 Hydroxy (Vit-D Deficiency, Fractures) - B12

## 2024-07-13 ENCOUNTER — Ambulatory Visit
Admission: RE | Admit: 2024-07-13 | Discharge: 2024-07-13 | Disposition: A | Discharge status: Home / Self Care | Source: Ambulatory Visit | Attending: Obstetrics & Gynecology | Admitting: Obstetrics & Gynecology

## 2024-07-13 ENCOUNTER — Other Ambulatory Visit: Payer: Self-pay | Admitting: Obstetrics & Gynecology

## 2024-07-13 DIAGNOSIS — R928 Other abnormal and inconclusive findings on diagnostic imaging of breast: Secondary | ICD-10-CM

## 2024-07-13 DIAGNOSIS — N6321 Unspecified lump in the left breast, upper outer quadrant: Secondary | ICD-10-CM | POA: Diagnosis not present

## 2024-07-13 DIAGNOSIS — N6320 Unspecified lump in the left breast, unspecified quadrant: Secondary | ICD-10-CM

## 2024-07-13 LAB — TSH: TSH: 1.56 u[IU]/mL (ref 0.450–4.500)

## 2024-07-13 LAB — CBC WITH DIFFERENTIAL/PLATELET
Basophils Absolute: 0.1 x10E3/uL (ref 0.0–0.2)
Basos: 1 %
EOS (ABSOLUTE): 0.2 x10E3/uL (ref 0.0–0.4)
Eos: 4 %
Hematocrit: 35.1 % (ref 34.0–46.6)
Hemoglobin: 10.6 g/dL — ABNORMAL LOW (ref 11.1–15.9)
Immature Grans (Abs): 0 x10E3/uL (ref 0.0–0.1)
Immature Granulocytes: 0 %
Lymphocytes Absolute: 1.6 x10E3/uL (ref 0.7–3.1)
Lymphs: 26 %
MCH: 26.4 pg — ABNORMAL LOW (ref 26.6–33.0)
MCHC: 30.2 g/dL — ABNORMAL LOW (ref 31.5–35.7)
MCV: 87 fL (ref 79–97)
Monocytes Absolute: 0.5 x10E3/uL (ref 0.1–0.9)
Monocytes: 8 %
Neutrophils Absolute: 3.9 x10E3/uL (ref 1.4–7.0)
Neutrophils: 61 %
Platelets: 249 x10E3/uL (ref 150–450)
RBC: 4.02 x10E6/uL (ref 3.77–5.28)
RDW: 13.1 % (ref 11.7–15.4)
WBC: 6.2 x10E3/uL (ref 3.4–10.8)

## 2024-07-13 LAB — VITAMIN D 25 HYDROXY (VIT D DEFICIENCY, FRACTURES): Vit D, 25-Hydroxy: 22.3 ng/mL — ABNORMAL LOW (ref 30.0–100.0)

## 2024-07-13 LAB — VITAMIN B12: Vitamin B-12: 831 pg/mL (ref 232–1245)

## 2024-07-14 ENCOUNTER — Other Ambulatory Visit: Payer: Self-pay | Admitting: Family Medicine

## 2024-07-14 ENCOUNTER — Other Ambulatory Visit: Payer: Self-pay

## 2024-07-14 ENCOUNTER — Other Ambulatory Visit (HOSPITAL_COMMUNITY): Payer: Self-pay

## 2024-07-14 DIAGNOSIS — F409 Phobic anxiety disorder, unspecified: Secondary | ICD-10-CM

## 2024-07-14 DIAGNOSIS — T7840XD Allergy, unspecified, subsequent encounter: Secondary | ICD-10-CM

## 2024-07-14 MED ORDER — CETIRIZINE HCL 10 MG PO TABS
10.0000 mg | ORAL_TABLET | Freq: Every day | ORAL | 3 refills | Status: AC
Start: 2024-07-14 — End: ?
  Filled 2024-07-14: qty 100, 100d supply, fill #0

## 2024-07-14 MED ORDER — TRAZODONE HCL 50 MG PO TABS
25.0000 mg | ORAL_TABLET | Freq: Every evening | ORAL | 0 refills | Status: DC | PRN
  Filled 2024-07-14: qty 30, 30d supply, fill #0

## 2024-07-19 ENCOUNTER — Ambulatory Visit (HOSPITAL_BASED_OUTPATIENT_CLINIC_OR_DEPARTMENT_OTHER): Payer: Self-pay | Admitting: Obstetrics & Gynecology

## 2024-07-19 DIAGNOSIS — E559 Vitamin D deficiency, unspecified: Secondary | ICD-10-CM

## 2024-07-19 DIAGNOSIS — N6489 Other specified disorders of breast: Secondary | ICD-10-CM | POA: Insufficient documentation

## 2024-07-19 MED ORDER — VITAMIN D (ERGOCALCIFEROL) 1.25 MG (50000 UNIT) PO CAPS
50000.0000 [IU] | ORAL_CAPSULE | ORAL | 0 refills | Status: AC
  Filled 2024-07-19: qty 12, 84d supply, fill #0

## 2024-07-20 ENCOUNTER — Other Ambulatory Visit: Payer: Self-pay

## 2024-08-10 ENCOUNTER — Ambulatory Visit (INDEPENDENT_AMBULATORY_CARE_PROVIDER_SITE_OTHER): Admitting: Obstetrics & Gynecology

## 2024-08-10 ENCOUNTER — Encounter (HOSPITAL_BASED_OUTPATIENT_CLINIC_OR_DEPARTMENT_OTHER): Payer: Self-pay | Admitting: Obstetrics & Gynecology

## 2024-08-10 ENCOUNTER — Other Ambulatory Visit (HOSPITAL_BASED_OUTPATIENT_CLINIC_OR_DEPARTMENT_OTHER): Payer: Self-pay

## 2024-08-10 VITALS — BP 129/82 | HR 70 | Ht 66.0 in | Wt 202.8 lb

## 2024-08-10 DIAGNOSIS — E559 Vitamin D deficiency, unspecified: Secondary | ICD-10-CM | POA: Diagnosis not present

## 2024-08-10 DIAGNOSIS — Z9889 Other specified postprocedural states: Secondary | ICD-10-CM

## 2024-08-10 MED ORDER — FLUZONE 0.5 ML IM SUSY
0.5000 mL | PREFILLED_SYRINGE | Freq: Once | INTRAMUSCULAR | 0 refills | Status: AC
  Filled 2024-08-10: qty 0.5, 1d supply, fill #0

## 2024-08-10 NOTE — Progress Notes (Deleted)
 GYNECOLOGY  VISIT  CC:   ***  HPI: 48 y.o. [id number] Married [name] here for recheck after undergoing TLH/bilateral salpingectomy/cystoscop on 06/13/2024 .   Past Medical History:  Diagnosis Date   Abnormal uterine bleeding (AUB)    History of iron deficiency anemia    Menorrhagia with irregular cycle    Mild intermittent asthma    Seasonal allergic rhinitis    Uterine fibroid     MEDS:   Current Outpatient Medications on File Prior to Visit  Medication Sig Dispense Refill   acetaminophen  (TYLENOL ) 325 MG tablet Take 650 mg by mouth every 6 (six) hours as needed.     acetic acid -hydrocortisone  (VOSOL -HC) OTIC solution Place 2 drops into the right ear at bedtime as needed. 10 mL 0   albuterol  (VENTOLIN  HFA) 108 (90 Base) MCG/ACT inhaler Inhale 2-4 puffs into the lungs every 4 (four) hours as needed for wheezing (or cough). 20.1 g 2   budesonide -formoterol  (SYMBICORT ) 160-4.5 MCG/ACT inhaler Inhale 2 puffs into the lungs 2 (two) times daily. 10.2 g 6   cetirizine  (ZYRTEC ) 10 MG tablet Take 1 tablet (10 mg total) by mouth daily. 100 tablet 3   Collagen-Vitamin C-Biotin (COLLAGEN PO) Take 2 capsules by mouth daily. (Patient not taking: Reported on 07/12/2024)     ibuprofen  (ADVIL ) 800 MG tablet Take 1 tablet (800 mg total) by mouth every 8 (eight) hours as needed. 30 tablet 0   montelukast  (SINGULAIR ) 10 MG tablet Take 1 tablet (10 mg total) by mouth at bedtime. (Patient not taking: Reported on 07/12/2024) 90 tablet 1   traZODone  (DESYREL ) 50 MG tablet Take 1/2-1 tablet (25-50 mg total) by mouth at bedtime as needed for sleep. 30 tablet 0   Vitamin D , Ergocalciferol , (DRISDOL ) 1.25 MG (50000 UNIT) CAPS capsule Take 1 capsule (50,000 Units total) by mouth every 7 (seven) days. 12 capsule 0   No current facility-administered medications on file prior to visit.    ALLERGIES: Penicillins  SH:  ***  ROS  PHYSICAL EXAMINATION:    LMP 05/27/2024 (Exact Date)      General appearance: alert, cooperative and appears stated age Neck: no adenopathy, supple, symmetrical, trachea midline and thyroid {CHL AMB PHY EX THYROID NORM DEFAULT:[phone]::normal to inspection and palpation} CV:  {Exam; heart brief:31539} Lungs:  {pe lungs ob:314451} Breasts: {Exam; breast:13139::normal appearance, no masses or tenderness} Abdomen: soft, non-tender; bowel sounds normal; no masses,  no organomegaly Lymph:  no inguinal LAD noted  Pelvic: External genitalia:  no lesions              Urethra:  normal appearing urethra with no masses, tenderness or lesions              Bartholins and Skenes: normal                 Vagina: {exam; pelvic vaginal:30846}              Cervix: {CHL AMB PHY EX CERVIX NORM DEFAULT:[phone]::no lesions}              Bimanual Exam:  Uterus:  {CHL AMB PHY EX UTERUS NORM DEFAULT:[phone]::normal size, contour, position, consistency, mobility, non-tender}              Adnexa: {CHL AMB PHY EX ADNEXA NO MASS [phone]::no mass, fullness, tenderness}              Rectovaginal: {yes no:[id number]}.  Confirms.  Anus:  normal sphincter tone, no lesions  Chaperone, ***, CMA, was present for exam.  Assessment/Plan: There are no diagnoses linked to this encounter.

## 2024-08-10 NOTE — Progress Notes (Signed)
 GYNECOLOGY  VISIT  CC:   post op recheck  HPI: 48 y.o. [id number] Married [name] here for recheck after undergoing TLH/bilateral salpingectomy/cystoscop on 06/13/2024.  She reports bleeding is none.  She has no pain.  Bowel function is Normal. Patient does report that she has had some issues with constipation but is taking Miralax  which has been helping. This is a chronic issues for her.  Bladder function is normal.  Energy is much better.  Umbillical is much better.  Has low Vit D.  Took her 50,000IU daily for one week.  Rx said weekly but she just didn't see that instruction.  Will check the level again today.    Had breast hamartoma noted with mammogram.  Had follow up imaging.  Repeat scheduled 6 months.     MEDS:   Current Outpatient Medications on File Prior to Visit  Medication Sig Dispense Refill   acetaminophen  (TYLENOL ) 325 MG tablet Take 650 mg by mouth every 6 (six) hours as needed.     acetic acid -hydrocortisone  (VOSOL -HC) OTIC solution Place 2 drops into the right ear at bedtime as needed. 10 mL 0   albuterol  (VENTOLIN  HFA) 108 (90 Base) MCG/ACT inhaler Inhale 2-4 puffs into the lungs every 4 (four) hours as needed for wheezing (or cough). 20.1 g 2   budesonide -formoterol  (SYMBICORT ) 160-4.5 MCG/ACT inhaler Inhale 2 puffs into the lungs 2 (two) times daily. 10.2 g 6   cetirizine  (ZYRTEC ) 10 MG tablet Take 1 tablet (10 mg total) by mouth daily. 100 tablet 3   Collagen-Vitamin C-Biotin (COLLAGEN PO) Take 2 capsules by mouth daily. (Patient not taking: Reported on 07/12/2024)     ibuprofen  (ADVIL ) 800 MG tablet Take 1 tablet (800 mg total) by mouth every 8 (eight) hours as needed. 30 tablet 0   montelukast  (SINGULAIR ) 10 MG tablet Take 1 tablet (10 mg total) by mouth at bedtime. (Patient not taking: Reported on 07/12/2024) 90 tablet 1   traZODone  (DESYREL ) 50 MG tablet Take 1/2-1 tablet (25-50 mg total) by mouth at bedtime as needed for sleep. 30 tablet 0   Vitamin  D, Ergocalciferol , (DRISDOL ) 1.25 MG (50000 UNIT) CAPS capsule Take 1 capsule (50,000 Units total) by mouth every 7 (seven) days. 12 capsule 0   No current facility-administered medications on file prior to visit.    SH:  Smoking No    PHYSICAL EXAMINATION:    BP 129/82 (BP Location: Right Arm, Patient Position: Sitting, Cuff Size: Large)   Pulse 70   Ht 5' 6 (1.676 m)   Wt 202 lb 12.8 oz (92 kg)   LMP 05/27/2024 (Exact Date)   SpO2 100%   BMI 32.73 kg/m     General appearance: alert, cooperative and appears stated age Abdomen: soft, non-tender; bowel sounds normal; no masses,  no organomegaly Incisions:  C/D/I  Pelvic: External genitalia:  no lesions              Urethra:  normal appearing urethra with no masses, tenderness or lesions              Bartholins and Skenes: normal                 Vagina: normal appearing vagina with normal color and discharge, no lesions              Cervix: absent              Bimanual Exam:  Uterus:  uterus absent  Adnexa: no mass, fullness, tenderness   Cuff well approximated, no masses or fullness.  Non tender  Assessment/Plan: 1. Post-operative state (Primary) - pt is released to return to work without restrictions  2. Vitamin D  deficiency - pt took her Vit D daily and not weekly.  Will recheck this level today and then make additional recommendations - VITAMIN D  25 Hydroxy (Vit-D Deficiency, Fractures)

## 2024-08-11 LAB — VITAMIN D 25 HYDROXY (VIT D DEFICIENCY, FRACTURES): Vit D, 25-Hydroxy: 59.1 ng/mL (ref 30.0–100.0)

## 2024-08-14 ENCOUNTER — Ambulatory Visit (HOSPITAL_BASED_OUTPATIENT_CLINIC_OR_DEPARTMENT_OTHER): Payer: Self-pay | Admitting: Obstetrics & Gynecology

## 2024-09-04 ENCOUNTER — Encounter: Payer: Self-pay | Admitting: Family Medicine

## 2024-09-07 ENCOUNTER — Other Ambulatory Visit: Payer: Self-pay

## 2024-10-23 ENCOUNTER — Other Ambulatory Visit (HOSPITAL_BASED_OUTPATIENT_CLINIC_OR_DEPARTMENT_OTHER): Payer: Self-pay | Admitting: Obstetrics & Gynecology

## 2024-10-23 DIAGNOSIS — E559 Vitamin D deficiency, unspecified: Secondary | ICD-10-CM

## 2024-10-26 ENCOUNTER — Other Ambulatory Visit: Payer: Self-pay | Admitting: Family Medicine

## 2024-10-26 ENCOUNTER — Other Ambulatory Visit (HOSPITAL_COMMUNITY): Payer: Self-pay

## 2024-10-26 DIAGNOSIS — F5105 Insomnia due to other mental disorder: Secondary | ICD-10-CM

## 2024-10-26 MED ORDER — TRAZODONE HCL 50 MG PO TABS
25.0000 mg | ORAL_TABLET | Freq: Every evening | ORAL | 0 refills | Status: AC | PRN
  Filled 2024-10-26: qty 30, 30d supply, fill #0

## 2024-11-09 ENCOUNTER — Encounter: Payer: Self-pay | Admitting: Family Medicine

## 2024-11-27 ENCOUNTER — Telehealth: Payer: Self-pay | Admitting: Family Medicine

## 2024-11-27 NOTE — Telephone Encounter (Signed)
 Pt dropped off form at front desk for [employer].  Verified that patient section of form has been completed.  Last DOS/WCC with PCP was 07/04/2024.  Placed form in [facility] team folder to be completed by clinical staff.  Brooke [name]

## 2024-11-27 NOTE — Telephone Encounter (Signed)
 Clinical info completed on Parking Place Card form.  Placed form in PCP's box for completion.    When form is completed, please route note to RN Team and place in wall pocket in front office.   Nelson Land, CMA

## 2024-11-30 NOTE — Telephone Encounter (Signed)
Form placed up front for pick up.   Copy made for batch scanning.   Patient has been made aware.

## 2024-12-11 ENCOUNTER — Emergency Department (HOSPITAL_BASED_OUTPATIENT_CLINIC_OR_DEPARTMENT_OTHER)

## 2024-12-11 ENCOUNTER — Encounter (HOSPITAL_BASED_OUTPATIENT_CLINIC_OR_DEPARTMENT_OTHER): Payer: Self-pay

## 2024-12-11 ENCOUNTER — Other Ambulatory Visit: Payer: Self-pay

## 2024-12-11 ENCOUNTER — Other Ambulatory Visit (HOSPITAL_BASED_OUTPATIENT_CLINIC_OR_DEPARTMENT_OTHER): Payer: Self-pay

## 2024-12-11 ENCOUNTER — Emergency Department (HOSPITAL_BASED_OUTPATIENT_CLINIC_OR_DEPARTMENT_OTHER)
Admission: EM | Admit: 2024-12-11 | Discharge: 2024-12-11 | Disposition: A | Discharge status: Home / Self Care | Attending: Emergency Medicine | Admitting: Emergency Medicine

## 2024-12-11 DIAGNOSIS — J069 Acute upper respiratory infection, unspecified: Secondary | ICD-10-CM | POA: Insufficient documentation

## 2024-12-11 DIAGNOSIS — B974 Respiratory syncytial virus as the cause of diseases classified elsewhere: Secondary | ICD-10-CM | POA: Insufficient documentation

## 2024-12-11 DIAGNOSIS — B338 Other specified viral diseases: Secondary | ICD-10-CM

## 2024-12-11 LAB — RESP PANEL BY RT-PCR (RSV, FLU A&B, COVID)  RVPGX2
Influenza A by PCR: NEGATIVE
Influenza B by PCR: NEGATIVE
Resp Syncytial Virus by PCR: POSITIVE — AB
SARS Coronavirus 2 by RT PCR: NEGATIVE

## 2024-12-11 MED ORDER — BENZONATATE 100 MG PO CAPS
100.0000 mg | ORAL_CAPSULE | Freq: Three times a day (TID) | ORAL | 0 refills | Status: AC
  Filled 2024-12-11: qty 21, 7d supply, fill #0

## 2024-12-11 NOTE — ED Notes (Signed)
 Pt d/c instructions, medications, and follow-up care reviewed with pt. Pt verbalized understanding and had no further questions at time of d/c. Pt CA&Ox4, ambulatory, and in NAD at time of d/c

## 2024-12-11 NOTE — Discharge Instructions (Addendum)
 Tested positive for RSV which is a respiratory virus.  Cough medicine has been prescribed.  Recommend Tylenol  and Motrin  for muscle aches and fever, continue oral rehydration outpatient, follow-up with a primary care provider outpatient as needed.  Return to the Emergency Department for any severe worsening symptoms.

## 2025-01-12 ENCOUNTER — Other Ambulatory Visit
